# Patient Record
Sex: Male | Born: 1955 | Race: White | Hispanic: No | Marital: Married | Smoking: Current every day smoker
Health system: Southern US, Community
[De-identification: ages and names within clinical notes are randomized; demographics above are authoritative.]

## PROBLEM LIST (undated history)

## (undated) DIAGNOSIS — I10 Essential (primary) hypertension: Secondary | ICD-10-CM

## (undated) DIAGNOSIS — Z9889 Other specified postprocedural states: Secondary | ICD-10-CM

## (undated) DIAGNOSIS — K829 Disease of gallbladder, unspecified: Secondary | ICD-10-CM

## (undated) DIAGNOSIS — K219 Gastro-esophageal reflux disease without esophagitis: Secondary | ICD-10-CM

## (undated) DIAGNOSIS — Z8719 Personal history of other diseases of the digestive system: Secondary | ICD-10-CM

## (undated) DIAGNOSIS — K5792 Diverticulitis of intestine, part unspecified, without perforation or abscess without bleeding: Secondary | ICD-10-CM

## (undated) HISTORY — PX: HERNIA REPAIR: SHX51

---

## 2019-12-23 ENCOUNTER — Other Ambulatory Visit: Payer: Self-pay

## 2019-12-23 ENCOUNTER — Ambulatory Visit (INDEPENDENT_AMBULATORY_CARE_PROVIDER_SITE_OTHER): Payer: 59 | Admitting: Gastroenterology

## 2019-12-23 VITALS — BP 97/67 | HR 75 | Temp 97.8°F | Ht 70.0 in | Wt 176.6 lb

## 2019-12-23 DIAGNOSIS — K5900 Constipation, unspecified: Secondary | ICD-10-CM | POA: Diagnosis not present

## 2019-12-23 DIAGNOSIS — K8050 Calculus of bile duct without cholangitis or cholecystitis without obstruction: Secondary | ICD-10-CM

## 2019-12-23 DIAGNOSIS — K5732 Diverticulitis of large intestine without perforation or abscess without bleeding: Secondary | ICD-10-CM | POA: Diagnosis not present

## 2019-12-23 DIAGNOSIS — Z791 Long term (current) use of non-steroidal anti-inflammatories (NSAID): Secondary | ICD-10-CM | POA: Diagnosis not present

## 2019-12-23 MED ORDER — OMEPRAZOLE 40 MG PO CPDR: 40.0000 mg | DELAYED_RELEASE_CAPSULE | Freq: Every day | ORAL | 3 refills | Status: DC

## 2019-12-23 NOTE — Progress Notes (Signed)
Maurice Bellows MD, MRCP(U.K) [address]  [place]  [place], [place] [address]  Main: [phone]  Fax: [phone]   Gastroenterology Consultation  Referring Provider:     Roselee Culver, MD Primary Care Physician:  Suzan Garibaldi, FNP Primary Gastroenterologist:  Dr. Jonathon Miranda  Reason for Consultation:     Diverticulitis        HPI:   Maurice Miranda is a 64 y.o. y/o male referred forDiverticulitis.  The patient presented to the emergency room on 11/04/2019 at [facility] with left lower quadrant pain.  CT scan of the abdomen and pelvis showed diverticulosis and no clear evidence of diverticulitis.  The patient had leukocytosis and due to high clinical suspicion for diverticulitis despite negative imaging was given antibiotics.  Evaluated by general surgery at that point of time and agreed with the plan.  There was also concern for 1.4 cm stone in the neck of a mildly distended gallbladder.  Normal LFTs and lipase and no right upper quadrant pain.  He was discharged home to follow-up as an outpatient.  11/30/2019 seen by general surgery as an outpatient noted to have epigastric and right upper quadrant pain.  No prior colonoscopy.  As per the general surgery note he had a colonoscopy scheduled on 12/22/2019.  And note was also mentioned that there was no pressing urgent need for cholecystectomy at this point of time.  I reviewed the CT scan on 11/04/2019 which showed a dilation of the main pancreatic duct in the head of 4 mm.  Mild dilation of the CBD at 9 mm.LFTs were normal..   He states that he is here to see me because his colonoscopy that was scheduled by [facility] was out of network and was canceled at the last moment.  He states that he has had right upper quadrant pain on and off associated after eating greasy food for over a year.  In addition has had constipation since 1 year does not have a bowel movement every day very hard and causes abdominal discomfort.  Occasionally noticed blood on  the stool.  No family history of colon cancer or polyps.  No prior colonoscopy in the past.  He has been taking BC powders on a daily basis for many years.  He is a Pharmacist, community.  Denies any excess alcohol consumption.   Prior to Admission medications   Not on File    No family history on file.   Social History   Tobacco Use  . Smoking status: Not on file  Substance Use Topics  . Alcohol use: Not on file  . Drug use: Not on file    Allergies as of 12/23/2019  . (No Known Allergies)    Review of Systems:    All systems reviewed and negative except where noted in HPI.   Physical Exam:  BP 97/67   Pulse 75   Temp 97.8 F (36.6 C)   Ht 5\' 10"  (1.778 m)   Wt 176 lb 9.6 oz (80.1 kg)   BMI 25.34 kg/m  No LMP for male patient. Psych:  Alert and cooperative. Normal mood and affect. General:   Alert,  Well-developed, well-nourished, pleasant and cooperative in NAD Head:  Normocephalic and atraumatic. Eyes:  Sclera clear, no icterus.   Conjunctiva pink.     Lungs:  Respirations even and unlabored.  Clear throughout to auscultation.   No wheezes, crackles, or rhonchi. No acute distress. Heart:  Regular rate and rhythm; no murmurs, clicks, rubs, or gallops. Abdomen:  Normal bowel sounds.  No bruits.  Soft, non-tender and non-distended without masses, hepatosplenomegaly or hernias noted.  No guarding or rebound tenderness.    Neurologic:  Alert and oriented x3;  grossly normal neurologically. Psych:  Alert and cooperative. Normal mood and affect.  Imaging Studies: No results found.  Assessment and Plan:   Maurice Miranda is a 64 y.o. y/o male has been referred for diverticulitis.  Empirically treated for diverticulitis in January 2020 with no radiological evidence of the same.  He has no left lower quadrant pain at this point of time.  But he does have symptomatic biliary colic with stone seen in his gallbladder.  He also suffers from constipation and on and off rectal  bleeding.  Plan 1.  Incidentally noted dilated common bile duct on CT scan in January and dilated pancreatic duct in the head of the pancreas.  Will need further evaluation with MRCP.  LFTs have been normal. 2.  Colonoscopy to evaluate diverticulitis since he has never had one before. 3.  Stop all NSAID use. 4.  H. pylori breath test 5.  Commence on a PPI Prilosec 40 mg once a day 6.  Commence on MiraLAX 1 capful twice daily 7.  Refer to surgery for cholecystectomy due to symptomatic biliary colic Dr.Pabon  I have discussed alternative options, risks & benefits,  which include, but are not limited to, bleeding, infection, perforation,respiratory complication & drug reaction.  The patient agrees with this plan & written consent will be obtained.     Follow up in 6 weeks  Dr Maurice Bellows MD,MRCP(U.K)

## 2019-12-25 ENCOUNTER — Telehealth: Payer: Self-pay

## 2019-12-25 LAB — H. PYLORI BREATH TEST: H pylori Breath Test: NEGATIVE

## 2019-12-25 NOTE — Telephone Encounter (Signed)
Spoke with pt and informed him of MRI appointment information.

## 2019-12-28 ENCOUNTER — Encounter: Payer: Self-pay | Admitting: Gastroenterology

## 2019-12-29 ENCOUNTER — Other Ambulatory Visit: Payer: Self-pay

## 2019-12-29 ENCOUNTER — Ambulatory Visit
Admission: RE | Admit: 2019-12-29 | Discharge: 2019-12-29 | Disposition: A | Discharge status: Home / Self Care | Payer: 59 | Source: Ambulatory Visit | Attending: Gastroenterology | Admitting: Gastroenterology

## 2019-12-29 ENCOUNTER — Other Ambulatory Visit: Payer: Self-pay | Admitting: Gastroenterology

## 2019-12-29 DIAGNOSIS — K8050 Calculus of bile duct without cholangitis or cholecystitis without obstruction: Secondary | ICD-10-CM | POA: Diagnosis not present

## 2019-12-29 MED ORDER — GADOBUTROL 1 MMOL/ML IV SOLN
8.0000 mL | Freq: Once | INTRAVENOUS | Status: AC | PRN
  Administered 2019-12-29: 8 mL via INTRAVENOUS

## 2020-01-03 ENCOUNTER — Encounter: Payer: Self-pay | Admitting: Gastroenterology

## 2020-01-04 ENCOUNTER — Telehealth: Payer: Self-pay

## 2020-01-04 NOTE — Telephone Encounter (Signed)
-----   Message from Jonathon Bellows, MD sent at 01/03/2020  7:45 PM EDT ----- Normal except for gall stones .

## 2020-01-04 NOTE — Telephone Encounter (Signed)
Spoke with pt and informed him of MRI result.

## 2020-01-04 NOTE — Telephone Encounter (Signed)
Called pt to inform him of MRI result.  Unable to contact, LVM to return call

## 2020-01-06 ENCOUNTER — Other Ambulatory Visit: Payer: Self-pay

## 2020-01-06 ENCOUNTER — Ambulatory Visit (INDEPENDENT_AMBULATORY_CARE_PROVIDER_SITE_OTHER): Payer: 59 | Admitting: Surgery

## 2020-01-06 ENCOUNTER — Encounter: Payer: Self-pay | Admitting: Surgery

## 2020-01-06 VITALS — BP 107/68 | HR 78 | Temp 97.7°F | Resp 12 | Ht 70.0 in | Wt 176.6 lb

## 2020-01-06 DIAGNOSIS — K8020 Calculus of gallbladder without cholecystitis without obstruction: Secondary | ICD-10-CM | POA: Diagnosis not present

## 2020-01-06 NOTE — Patient Instructions (Addendum)
Our surgery will contact you to schedule your surgery. Please have the BLUE SHEET available when she calls you.   Cholelithiasis  Cholelithiasis is also called "gallstones." It is a kind of gallbladder disease. The gallbladder is an organ that stores a liquid (bile) that helps you digest fat. Gallstones may not cause symptoms (may be silent gallstones) until they cause a blockage, and then they can cause pain (gallbladder attack). Follow these instructions at home:  Take over-the-counter and prescription medicines only as told by your doctor.  Stay at a healthy weight.  Eat healthy foods. This includes: ? Eating fewer fatty foods, like fried foods. ? Eating fewer refined carbs (refined carbohydrates). Refined carbs are breads and grains that are highly processed, like white bread and white rice. Instead, choose whole grains like whole-wheat bread and brown rice. ? Eating more fiber. Almonds, fresh fruit, and beans are healthy sources of fiber.  Keep all follow-up visits as told by your doctor. This is important. Contact a doctor if:  You have sudden pain in the upper right side of your belly (abdomen). Pain might spread to your right shoulder or your chest. This may be a sign of a gallbladder attack.  You feel sick to your stomach (are nauseous).  You throw up (vomit).  You have been diagnosed with gallstones that have no symptoms and you get: ? Belly pain. ? Discomfort, burning, or fullness in the upper part of your belly (indigestion). Get help right away if:  You have sudden pain in the upper right side of your belly, and it lasts for more than 2 hours.  You have belly pain that lasts for more than 5 hours.  You have a fever or chills.  You keep feeling sick to your stomach or you keep throwing up.  Your skin or the whites of your eyes turn yellow (jaundice).  You have dark-colored pee (urine).  You have light-colored poop (stool). Summary  Cholelithiasis is also  called "gallstones."  The gallbladder is an organ that stores a liquid (bile) that helps you digest fat.  Silent gallstones are gallstones that do not cause symptoms.  A gallbladder attack may cause sudden pain in the upper right side of your belly. Pain might spread to your right shoulder or your chest. If this happens, contact your doctor.  If you have sudden pain in the upper right side of your belly that lasts for more than 2 hours, get help right away. This information is not intended to replace advice given to you by your health care provider. Make sure you discuss any questions you have with your health care provider. Document Revised: 09/06/2017 Document Reviewed: 06/10/2016 Elsevier Patient Education  2020 [employer].

## 2020-01-07 ENCOUNTER — Other Ambulatory Visit
Admission: RE | Admit: 2020-01-07 | Discharge: 2020-01-07 | Disposition: A | Discharge status: Home / Self Care | Payer: 59 | Source: Ambulatory Visit | Attending: Gastroenterology | Admitting: Gastroenterology

## 2020-01-07 DIAGNOSIS — Z20822 Contact with and (suspected) exposure to covid-19: Secondary | ICD-10-CM | POA: Insufficient documentation

## 2020-01-07 DIAGNOSIS — Z01812 Encounter for preprocedural laboratory examination: Secondary | ICD-10-CM | POA: Diagnosis not present

## 2020-01-07 LAB — SARS CORONAVIRUS 2 (TAT 6-24 HRS): SARS Coronavirus 2: NEGATIVE

## 2020-01-07 NOTE — H&P (View-Only) (Signed)
Patient ID: Maurice Miranda, male   DOB: 1955/11/24, 64 y.o.   MRN: [id number]  HPI Maurice Miranda is a 64 y.o. male in consultation at the request of Dr. Vicente Males for right upper quadrant pain.  He reports he has this subacute pain for the last couple months.  He states that his pain is within the right upper quadrant usually following greasy foods.  It is sharp is moderate and intermittent.  He also reports that the pain radiates to the back and to his right shoulder.  He does have a history of diverticulosis that apparently clinically developed diverticulitis earlier in the year.  He underwent work-up including a CT scan and it at a outside facility that I have personally reviewed the report showing evidence of diverticulosis without diverticulitis.  He also had evidence of questionable biliary disease and Vicente Males ordered an MRCP that I have personally reviewed showing evidence of cholelithiasis.  Reviewing records from outside facility it seems that he did have cholelithiasis with actually increased in the common bile duct up to 9 mm. He does have an upcoming colonoscopy next week by Dr. Vicente Males.  CBC and CMP were completely normal.     HPI  History reviewed. No pertinent past medical history.  History reviewed. No pertinent surgical history.  Family History  Problem Relation Age of Onset  . Diverticulitis Mother     Social History Social History   Tobacco Use  . Smoking status: Current Every Day Smoker    Packs/day: 0.50    Years: 45.00    Pack years: 22.50    Types: Cigarettes  . Smokeless tobacco: Former Systems developer    Types: Chew  Substance Use Topics  . Alcohol use: Never  . Drug use: Never    No Known Allergies  Current Outpatient Medications  Medication Sig Dispense Refill  . acetaminophen (TYLENOL) 325 MG tablet Take by mouth.    Marland Kitchen albuterol (VENTOLIN HFA) 108 (90 Base) MCG/ACT inhaler Inhale into the lungs.    . busPIRone (BUSPAR) 7.5 MG tablet Take 7.5 mg by mouth 2 (two) times  daily.    . [employer] BISACODYL 5 MG EC tablet Take 5 mg by mouth once.    . hydrOXYzine (VISTARIL) 25 MG capsule Take 25 mg by mouth 2 (two) times daily.    Marland Kitchen lisinopril-hydrochlorothiazide (ZESTORETIC) 20-12.5 MG tablet Take by mouth.    Marland Kitchen omeprazole (PRILOSEC) 40 MG capsule Take 1 capsule (40 mg total) by mouth daily. 90 capsule 3  . pantoprazole (PROTONIX) 40 MG tablet Take 40 mg by mouth daily.    Marland Kitchen SPIRIVA RESPIMAT 2.5 MCG/ACT AERS SMARTSIG:2 Puff(s) By Mouth Every Morning     No current facility-administered medications for this visit.     Review of Systems Full ROS  was asked and was negative except for the information on the HPI  Physical Exam Blood pressure 107/68, pulse 78, temperature 97.7 F (36.5 C), resp. rate 12, height 5\' 10"  (1.778 m), weight 176 lb 9.6 oz (80.1 kg), SpO2 98 %. CONSTITUTIONAL: NAD EYES: Pupils are equal, round, and reactive to light, Sclera are non-icteric. EARS, NOSE, MOUTH AND THROAT: The oropharynx is clear. The oral mucosa is pink and moist. Hearing is intact to voice. LYMPH NODES:  Lymph nodes in the neck are normal. RESPIRATORY:  Lungs are clear. There is normal respiratory effort, with equal breath sounds bilaterally, and without pathologic use of accessory muscles. CARDIOVASCULAR: Heart is regular without murmurs, gallops, or rubs. GI: The abdomen is  soft,  mild tenderness to palpation in the right upper quadrant.  No peritonitis.  There are no palpable masses. There is no hepatosplenomegaly. There are normal bowel sounds in all quadrants. GU: Rectal deferred.   MUSCULOSKELETAL: Normal muscle strength and tone. No cyanosis or edema.   SKIN: Turgor is good and there are no pathologic skin lesions or ulcers. NEUROLOGIC: Motor and sensation is grossly normal. Cranial nerves are grossly intact. PSYCH:  Oriented to person, place and time. Affect is normal.  Data Reviewed  I have personally reviewed the patient's imaging, laboratory findings and medical  records.    Assessment/Plan 64 year old male with right upper quadrant pain consistent with biliary colic.  In addition to that he does have lower abdominal pain that is nonspecific and could certainly be explained by diverticulitis or irritable bowel syndrome.  Most pressing issue at this time is definitely his gallbladder.  I do believe that he is a good candidate for robotic cholecystectomy. I discussed the procedure in detail.  The patient was given Neurosurgeon.  We discussed the risks and benefits of a laparoscopic cholecystectomy and possible cholangiogram including, but not limited to bleeding, infection, injury to surrounding structures such as the intestine or liver, bile leak, retained gallstones, need to convert to an open procedure, prolonged diarrhea, blood clots such as  DVT, common bile duct injury, anesthesia risks, and possible need for additional procedures.  The likelihood of improvement in symptoms and return to the patient's normal status is good. We discussed the typical post-operative recovery course. Time spent with the patient was 60 minutes, with more than 50% of the time spent in face-to-face education, counseling and care coordination.   Copy of this report was sent to the referring provider   Caroleen Hamman, MD FACS General Surgeon 01/07/2020, 3:06 PM

## 2020-01-07 NOTE — Progress Notes (Signed)
Patient ID: Maurice Miranda, male   DOB: April 28, 1956, 64 y.o.   MRN: [id number]  HPI Maurice Miranda is a 64 y.o. male in consultation at the request of Dr. Vicente Males for right upper quadrant pain.  He reports he has this subacute pain for the last couple months.  He states that his pain is within the right upper quadrant usually following greasy foods.  It is sharp is moderate and intermittent.  He also reports that the pain radiates to the back and to his right shoulder.  He does have a history of diverticulosis that apparently clinically developed diverticulitis earlier in the year.  He underwent work-up including a CT scan and it at a outside facility that I have personally reviewed the report showing evidence of diverticulosis without diverticulitis.  He also had evidence of questionable biliary disease and Vicente Males ordered an MRCP that I have personally reviewed showing evidence of cholelithiasis.  Reviewing records from outside facility it seems that he did have cholelithiasis with actually increased in the common bile duct up to 9 mm. He does have an upcoming colonoscopy next week by Dr. Vicente Males.  CBC and CMP were completely normal.     HPI  History reviewed. No pertinent past medical history.  History reviewed. No pertinent surgical history.  Family History  Problem Relation Age of Onset  . Diverticulitis Mother     Social History Social History   Tobacco Use  . Smoking status: Current Every Day Smoker    Packs/day: 0.50    Years: 45.00    Pack years: 22.50    Types: Cigarettes  . Smokeless tobacco: Former Systems developer    Types: Chew  Substance Use Topics  . Alcohol use: Never  . Drug use: Never    No Known Allergies  Current Outpatient Medications  Medication Sig Dispense Refill  . acetaminophen (TYLENOL) 325 MG tablet Take by mouth.    Marland Kitchen albuterol (VENTOLIN HFA) 108 (90 Base) MCG/ACT inhaler Inhale into the lungs.    . busPIRone (BUSPAR) 7.5 MG tablet Take 7.5 mg by mouth 2 (two) times  daily.    . CVS BISACODYL 5 MG EC tablet Take 5 mg by mouth once.    . hydrOXYzine (VISTARIL) 25 MG capsule Take 25 mg by mouth 2 (two) times daily.    Marland Kitchen lisinopril-hydrochlorothiazide (ZESTORETIC) 20-12.5 MG tablet Take by mouth.    Marland Kitchen omeprazole (PRILOSEC) 40 MG capsule Take 1 capsule (40 mg total) by mouth daily. 90 capsule 3  . pantoprazole (PROTONIX) 40 MG tablet Take 40 mg by mouth daily.    Marland Kitchen SPIRIVA RESPIMAT 2.5 MCG/ACT AERS SMARTSIG:2 Puff(s) By Mouth Every Morning     No current facility-administered medications for this visit.     Review of Systems Full ROS  was asked and was negative except for the information on the HPI  Physical Exam Blood pressure 107/68, pulse 78, temperature 97.7 F (36.5 C), resp. rate 12, height 5\' 10"  (1.778 m), weight 176 lb 9.6 oz (80.1 kg), SpO2 98 %. CONSTITUTIONAL: NAD EYES: Pupils are equal, round, and reactive to light, Sclera are non-icteric. EARS, NOSE, MOUTH AND THROAT: The oropharynx is clear. The oral mucosa is pink and moist. Hearing is intact to voice. LYMPH NODES:  Lymph nodes in the neck are normal. RESPIRATORY:  Lungs are clear. There is normal respiratory effort, with equal breath sounds bilaterally, and without pathologic use of accessory muscles. CARDIOVASCULAR: Heart is regular without murmurs, gallops, or rubs. GI: The abdomen is  soft,  mild tenderness to palpation in the right upper quadrant.  No peritonitis.  There are no palpable masses. There is no hepatosplenomegaly. There are normal bowel sounds in all quadrants. GU: Rectal deferred.   MUSCULOSKELETAL: Normal muscle strength and tone. No cyanosis or edema.   SKIN: Turgor is good and there are no pathologic skin lesions or ulcers. NEUROLOGIC: Motor and sensation is grossly normal. Cranial nerves are grossly intact. PSYCH:  Oriented to person, place and time. Affect is normal.  Data Reviewed  I have personally reviewed the patient's imaging, laboratory findings and medical  records.    Assessment/Plan 64 year old male with right upper quadrant pain consistent with biliary colic.  In addition to that he does have lower abdominal pain that is nonspecific and could certainly be explained by diverticulitis or irritable bowel syndrome.  Most pressing issue at this time is definitely his gallbladder.  I do believe that he is a good candidate for robotic cholecystectomy. I discussed the procedure in detail.  The patient was given Neurosurgeon.  We discussed the risks and benefits of a laparoscopic cholecystectomy and possible cholangiogram including, but not limited to bleeding, infection, injury to surrounding structures such as the intestine or liver, bile leak, retained gallstones, need to convert to an open procedure, prolonged diarrhea, blood clots such as  DVT, common bile duct injury, anesthesia risks, and possible need for additional procedures.  The likelihood of improvement in symptoms and return to the patient's normal status is good. We discussed the typical post-operative recovery course. Time spent with the patient was 60 minutes, with more than 50% of the time spent in face-to-face education, counseling and care coordination.   Copy of this report was sent to the referring provider   Caroleen Hamman, MD FACS General Surgeon 01/07/2020, 3:06 PM

## 2020-01-10 MED ORDER — INDOCYANINE GREEN 25 MG IV SOLR
7.5000 mg | Freq: Once | INTRAVENOUS | Status: DC
  Filled 2020-01-10: qty 10

## 2020-01-11 ENCOUNTER — Ambulatory Visit: Payer: 59 | Admitting: Anesthesiology

## 2020-01-11 ENCOUNTER — Other Ambulatory Visit: Payer: Self-pay

## 2020-01-11 ENCOUNTER — Emergency Department: Payer: 59

## 2020-01-11 ENCOUNTER — Emergency Department
Admission: EM | Admit: 2020-01-11 | Discharge: 2020-01-11 | Disposition: A | Discharge status: Home / Self Care | Payer: 59 | Source: Home / Self Care | Attending: Emergency Medicine | Admitting: Emergency Medicine

## 2020-01-11 ENCOUNTER — Encounter
Admission: RE | Disposition: A | Discharge status: Home / Self Care | Payer: Self-pay | Source: Home / Self Care | Attending: Gastroenterology

## 2020-01-11 ENCOUNTER — Ambulatory Visit
Admission: RE | Admit: 2020-01-11 | Discharge: 2020-01-11 | Disposition: A | Discharge status: Home / Self Care | Payer: 59 | Attending: Gastroenterology | Admitting: Gastroenterology

## 2020-01-11 ENCOUNTER — Encounter: Payer: Self-pay | Admitting: Emergency Medicine

## 2020-01-11 DIAGNOSIS — Z9889 Other specified postprocedural states: Secondary | ICD-10-CM | POA: Insufficient documentation

## 2020-01-11 DIAGNOSIS — Z79899 Other long term (current) drug therapy: Secondary | ICD-10-CM | POA: Insufficient documentation

## 2020-01-11 DIAGNOSIS — R1011 Right upper quadrant pain: Secondary | ICD-10-CM | POA: Insufficient documentation

## 2020-01-11 DIAGNOSIS — Z09 Encounter for follow-up examination after completed treatment for conditions other than malignant neoplasm: Secondary | ICD-10-CM | POA: Diagnosis not present

## 2020-01-11 DIAGNOSIS — F17210 Nicotine dependence, cigarettes, uncomplicated: Secondary | ICD-10-CM | POA: Insufficient documentation

## 2020-01-11 DIAGNOSIS — K635 Polyp of colon: Secondary | ICD-10-CM

## 2020-01-11 DIAGNOSIS — K5732 Diverticulitis of large intestine without perforation or abscess without bleeding: Secondary | ICD-10-CM

## 2020-01-11 DIAGNOSIS — K219 Gastro-esophageal reflux disease without esophagitis: Secondary | ICD-10-CM | POA: Insufficient documentation

## 2020-01-11 DIAGNOSIS — K8050 Calculus of bile duct without cholangitis or cholecystitis without obstruction: Secondary | ICD-10-CM

## 2020-01-11 DIAGNOSIS — D122 Benign neoplasm of ascending colon: Secondary | ICD-10-CM | POA: Insufficient documentation

## 2020-01-11 DIAGNOSIS — K8020 Calculus of gallbladder without cholecystitis without obstruction: Secondary | ICD-10-CM | POA: Insufficient documentation

## 2020-01-11 HISTORY — PX: COLONOSCOPY WITH PROPOFOL: SHX5780

## 2020-01-11 HISTORY — DX: Disease of gallbladder, unspecified: K82.9

## 2020-01-11 HISTORY — DX: Gastro-esophageal reflux disease without esophagitis: K21.9

## 2020-01-11 HISTORY — DX: Diverticulitis of intestine, part unspecified, without perforation or abscess without bleeding: K57.92

## 2020-01-11 HISTORY — DX: Other specified postprocedural states: Z98.890

## 2020-01-11 LAB — CBC WITH DIFFERENTIAL/PLATELET
Abs Immature Granulocytes: 0.06 10*3/uL (ref 0.00–0.07)
Basophils Absolute: 0.1 10*3/uL (ref 0.0–0.1)
Basophils Relative: 1 %
Eosinophils Absolute: 0.1 10*3/uL (ref 0.0–0.5)
Eosinophils Relative: 1 %
HCT: 44.6 % (ref 39.0–52.0)
Hemoglobin: 15.7 g/dL (ref 13.0–17.0)
Immature Granulocytes: 1 %
Lymphocytes Relative: 16 %
Lymphs Abs: 1.7 10*3/uL (ref 0.7–4.0)
MCH: 30.8 pg (ref 26.0–34.0)
MCHC: 35.2 g/dL (ref 30.0–36.0)
MCV: 87.5 fL (ref 80.0–100.0)
Monocytes Absolute: 0.5 10*3/uL (ref 0.1–1.0)
Monocytes Relative: 5 %
Neutro Abs: 8.3 10*3/uL — ABNORMAL HIGH (ref 1.7–7.7)
Neutrophils Relative %: 76 %
Platelets: 204 10*3/uL (ref 150–400)
RBC: 5.1 MIL/uL (ref 4.22–5.81)
RDW: 12.1 % (ref 11.5–15.5)
WBC: 10.7 10*3/uL — ABNORMAL HIGH (ref 4.0–10.5)
nRBC: 0 % (ref 0.0–0.2)

## 2020-01-11 LAB — COMPREHENSIVE METABOLIC PANEL
ALT: 25 U/L (ref 0–44)
AST: 22 U/L (ref 15–41)
Albumin: 4 g/dL (ref 3.5–5.0)
Alkaline Phosphatase: 72 U/L (ref 38–126)
Anion gap: 6 (ref 5–15)
BUN: 17 mg/dL (ref 8–23)
CO2: 25 mmol/L (ref 22–32)
Calcium: 8.8 mg/dL — ABNORMAL LOW (ref 8.9–10.3)
Chloride: 108 mmol/L (ref 98–111)
Creatinine, Ser: 0.84 mg/dL (ref 0.61–1.24)
GFR calc Af Amer: >60 mL/min (ref >60)
GFR calc non Af Amer: >60 mL/min (ref >60)
Glucose, Bld: 166 mg/dL — ABNORMAL HIGH (ref 70–99)
Potassium: 3.2 mmol/L — ABNORMAL LOW (ref 3.5–5.1)
Sodium: 139 mmol/L (ref 135–145)
Total Bilirubin: 0.8 mg/dL (ref 0.3–1.2)
Total Protein: 6.3 g/dL — ABNORMAL LOW (ref 6.5–8.1)

## 2020-01-11 LAB — LIPASE, BLOOD: Lipase: 39 U/L (ref 11–51)

## 2020-01-11 SURGERY — COLONOSCOPY WITH PROPOFOL
Anesthesia: General

## 2020-01-11 MED ORDER — ACETAMINOPHEN 500 MG PO TABS: 1000.0000 mg | ORAL_TABLET | ORAL | Status: DC

## 2020-01-11 MED ORDER — MORPHINE SULFATE (PF) 4 MG/ML IV SOLN
4.0000 mg | Freq: Once | INTRAVENOUS | Status: AC
  Administered 2020-01-11: 4 mg via INTRAVENOUS
  Filled 2020-01-11: qty 1

## 2020-01-11 MED ORDER — CEFAZOLIN SODIUM-DEXTROSE 2-4 GM/100ML-% IV SOLN: 2.0000 g | INTRAVENOUS | Status: DC

## 2020-01-11 MED ORDER — LACTATED RINGERS IV BOLUS
1000.0000 mL | Freq: Once | INTRAVENOUS | Status: AC
  Administered 2020-01-11: 1000 mL via INTRAVENOUS

## 2020-01-11 MED ORDER — PROPOFOL 500 MG/50ML IV EMUL
INTRAVENOUS | Status: DC | PRN
  Administered 2020-01-11: 50 ug/kg/min via INTRAVENOUS

## 2020-01-11 MED ORDER — ONDANSETRON 4 MG PO TBDP: 4.0000 mg | ORAL_TABLET | Freq: Three times a day (TID) | ORAL | 0 refills | Status: AC | PRN

## 2020-01-11 MED ORDER — GABAPENTIN 300 MG PO CAPS: 300.0000 mg | ORAL_CAPSULE | ORAL | Status: DC

## 2020-01-11 MED ORDER — MIDAZOLAM HCL 5 MG/5ML IJ SOLN
INTRAMUSCULAR | Status: DC | PRN
  Administered 2020-01-11: 2 mg via INTRAVENOUS

## 2020-01-11 MED ORDER — SODIUM CHLORIDE 0.9 % IV SOLN
INTRAVENOUS | Status: DC
  Administered 2020-01-11: 09:00:00 1000 mL via INTRAVENOUS

## 2020-01-11 MED ORDER — LIDOCAINE HCL (PF) 2 % IJ SOLN
INTRAMUSCULAR | Status: DC | PRN
  Administered 2020-01-11: 80 mg

## 2020-01-11 MED ORDER — ONDANSETRON HCL 4 MG/2ML IJ SOLN
4.0000 mg | Freq: Once | INTRAMUSCULAR | Status: AC
  Administered 2020-01-11: 4 mg via INTRAVENOUS
  Filled 2020-01-11: qty 2

## 2020-01-11 MED ORDER — LIDOCAINE HCL (PF) 2 % IJ SOLN
INTRAMUSCULAR | Status: AC
  Filled 2020-01-11: qty 10

## 2020-01-11 MED ORDER — MIDAZOLAM HCL 2 MG/2ML IJ SOLN
INTRAMUSCULAR | Status: AC
  Filled 2020-01-11: qty 2

## 2020-01-11 MED ORDER — FENTANYL CITRATE (PF) 100 MCG/2ML IJ SOLN
INTRAMUSCULAR | Status: AC
  Filled 2020-01-11: qty 2

## 2020-01-11 MED ORDER — FENTANYL CITRATE (PF) 100 MCG/2ML IJ SOLN
INTRAMUSCULAR | Status: DC | PRN
  Administered 2020-01-11: 50 ug via INTRAVENOUS
  Administered 2020-01-11 (×2): 25 ug via INTRAVENOUS

## 2020-01-11 MED ORDER — HYDROCODONE-ACETAMINOPHEN 5-325 MG PO TABS: 1.0000 | ORAL_TABLET | Freq: Four times a day (QID) | ORAL | 0 refills | Status: DC | PRN

## 2020-01-11 MED ORDER — CELECOXIB 200 MG PO CAPS: 200.0000 mg | ORAL_CAPSULE | ORAL | Status: DC

## 2020-01-11 MED ORDER — POTASSIUM CHLORIDE CRYS ER 20 MEQ PO TBCR
40.0000 meq | EXTENDED_RELEASE_TABLET | Freq: Once | ORAL | Status: AC
  Administered 2020-01-11: 40 meq via ORAL
  Filled 2020-01-11: qty 2

## 2020-01-11 MED ORDER — PROPOFOL 10 MG/ML IV BOLUS
INTRAVENOUS | Status: DC | PRN
  Administered 2020-01-11: 30 mg via INTRAVENOUS
  Administered 2020-01-11: 20 mg via INTRAVENOUS

## 2020-01-11 NOTE — ED Notes (Signed)
X-ray at bedside

## 2020-01-11 NOTE — Op Note (Signed)
[facility] Gastroenterology Patient Name: Maurice Miranda Procedure Date: 01/11/2020 9:25 AM MRN: [id number] Account #: [id number] Date of Birth: June 23, 1956 Admit Type: Ambulatory Age: 64 Room: [facility] ENDO ROOM 4 Gender: Male Note Status: Finalized Procedure:             Colonoscopy Indications:           Follow-up of diverticulitis Providers:             Jonathon Bellows MD, MD Medicines:             Monitored Anesthesia Care Complications:         No immediate complications. Procedure:             Pre-Anesthesia Assessment:                        - Prior to the procedure, a History and Physical was                         performed, and patient medications, allergies and                         sensitivities were reviewed. The patient's tolerance                         of previous anesthesia was reviewed.                        - The risks and benefits of the procedure and the                         sedation options and risks were discussed with the                         patient. All questions were answered and informed                         consent was obtained.                        - ASA Grade Assessment: II - A patient with mild                         systemic disease.                        After obtaining informed consent, the colonoscope was                         passed under direct vision. Throughout the procedure,                         the patient's blood pressure, pulse, and oxygen                         saturations were monitored continuously. The                         Colonoscope was introduced through the anus and  advanced to the the cecum, identified by the                         appendiceal orifice. The colonoscopy was performed                         with ease. The patient tolerated the procedure well.                         The quality of the bowel preparation was poor. Findings:      The perianal and digital  rectal examinations were normal.      A large amount of semi-liquid stool was found in the entire colon,       interfering with visualization.      Two sessile polyps were found in the ascending colon. The polyps were 6       to 10 mm in size. These polyps were removed with a cold snare. Resection       and retrieval were complete. To prevent bleeding after the polypectomy,       two hemostatic clips were successfully placed. There was no bleeding at       the end of the procedure. Impression:            - Preparation of the colon was poor.                        - Stool in the entire examined colon.                        - Two 6 to 10 mm polyps in the ascending colon,                         removed with a cold snare. Resected and retrieved.                         Clips were placed. Recommendation:        - Discharge patient to home (with escort).                        - Resume previous diet.                        - Continue present medications.                        - Await pathology results.                        - Repeat colonoscopy in 1 month because the bowel                         preparation was suboptimal. Procedure Code(s):     --- Professional ---                        [phone], Colonoscopy, flexible; with removal of                         tumor(s), polyp(s), or other lesion(s) by snare  technique Diagnosis Code(s):     --- Professional ---                        K63.5, Polyp of colon                        K57.32, Diverticulitis of large intestine without                         perforation or abscess without bleeding CPT copyright 2019 American Medical Association. All rights reserved. The codes documented in this report are preliminary and upon coder review may  be revised to meet current compliance requirements. Jonathon Bellows, MD Jonathon Bellows MD, MD 01/11/2020 9:53:26 AM This report has been signed electronically. Number of Addenda: 0 Note  Initiated On: 01/11/2020 9:25 AM Scope Withdrawal Time: 0 hours 16 minutes 50 seconds  Total Procedure Duration: 0 hours 21 minutes 7 seconds  Estimated Blood Loss:  Estimated blood loss: none.      [facility]

## 2020-01-11 NOTE — ED Triage Notes (Signed)
Pt here for severe RLQ pain. Unlabored.  Had colonoscopy this AM with polyp removal.  VSS at this time. No fever. Has had dry heaving.  Started shortly after colonoscopy today but has had intermittent similar pain over last 2 months. Mentioned known gallstone but this pain is RLQ and wraps around to right back.

## 2020-01-11 NOTE — Anesthesia Postprocedure Evaluation (Signed)
Anesthesia Post Note  Patient: Maurice Miranda  Procedure(s) Performed: COLONOSCOPY WITH PROPOFOL (N/A )  Patient location during evaluation: PACU Anesthesia Type: General Level of consciousness: awake and alert Pain management: pain level controlled Vital Signs Assessment: post-procedure vital signs reviewed and stable Respiratory status: spontaneous breathing, nonlabored ventilation and respiratory function stable Cardiovascular status: blood pressure returned to baseline and stable Postop Assessment: no apparent nausea or vomiting Anesthetic complications: no     Last Vitals:  Vitals:   01/11/20 1032 01/11/20 1033  BP:  135/86  Pulse: 66 66  Resp: (!) 28 (!) 25  Temp:    SpO2: 100% 99%    Last Pain:  Vitals:   01/11/20 1033  TempSrc:   PainSc: 0-No pain                 Tera Mater

## 2020-01-11 NOTE — Transfer of Care (Signed)
Immediate Anesthesia Transfer of Care Note  Patient: Maurice Miranda  Procedure(s) Performed: COLONOSCOPY WITH PROPOFOL (N/A )  Patient Location: PACU  Anesthesia Type:General  Level of Consciousness: sedated  Airway & Oxygen Therapy: Patient Spontanous Breathing and Patient connected to nasal cannula oxygen  Post-op Assessment: Report given to RN and Post -op Vital signs reviewed and stable  Post vital signs: Reviewed and stable  Last Vitals:  Vitals Value Taken Time  BP 87/54 01/11/20 0953  Temp 35.8 C 01/11/20 0952  Pulse 65 01/11/20 0954  Resp 25 01/11/20 0954  SpO2 99 % 01/11/20 0954  Vitals shown include unvalidated device data.  Last Pain:  Vitals:   01/11/20 0952  TempSrc: Temporal  PainSc: Asleep         Complications: No apparent anesthesia complications

## 2020-01-11 NOTE — ED Provider Notes (Signed)
[facility] Emergency Department Provider Note   ____________________________________________   First MD Initiated Contact with Patient 01/11/20 1306     (approximate)  I have reviewed the triage vital signs and the nursing notes.   HISTORY  Chief Complaint Abdominal Pain    HPI Maurice Miranda is a 64 y.o. male with possible history of gallstones and GERD who presents to the ED complaining of abdominal pain.  Patient reports that he has been having intermittent episodes of right upper quadrant abdominal pain for least the past couple of months.  He has been seen by general surgery for this after being diagnosed with gallstones, subsequently scheduled for cholecystectomy in 10 days.  He has also been following with GI and had a colonoscopy performed earlier today.  Colonoscopy showed poor prep as well as 2 small polyps that were removed.  Patient states he had been doing well earlier in the day prior to his colonoscopy, but shortly after the procedure he developed severe right upper quadrant pain similar to prior episodes.  It was initially relatively mild, but became more severe after he tried to eat lunch.  He has not had any nausea or vomiting he denies any fevers recently.  He describes the pain as stabbing and radiating around to his right flank.        Past Medical History:  Diagnosis Date  . Diverticulitis   . Gallbladder disease   . GERD (gastroesophageal reflux disease)   . History of colonoscopy     There are no problems to display for this patient.   History reviewed. No pertinent surgical history.  Prior to Admission medications   Medication Sig Start Date End Date Taking? Authorizing Provider  acetaminophen (TYLENOL) 325 MG tablet Take by mouth. 11/05/19   [provider]  albuterol (VENTOLIN HFA) 108 (90 Base) MCG/ACT inhaler Inhale into the lungs. 09/05/19   [provider]  busPIRone (BUSPAR) 7.5 MG tablet Take 7.5 mg  by mouth 2 (two) times daily. 12/08/19   [provider]  [facility] BISACODYL 5 MG EC tablet Take 5 mg by mouth once. 12/09/19   [provider]  HYDROcodone-acetaminophen (NORCO/VICODIN) 5-325 MG tablet Take 1 tablet by mouth every 6 (six) hours as needed for moderate pain. 01/11/20   Blake Divine, MD  hydrOXYzine (VISTARIL) 25 MG capsule Take 25 mg by mouth 2 (two) times daily. 09/30/19   [provider]  lisinopril-hydrochlorothiazide (ZESTORETIC) 20-12.5 MG tablet Take by mouth. 09/24/19   [provider]  omeprazole (PRILOSEC) 40 MG capsule Take 1 capsule (40 mg total) by mouth daily. 12/23/19   Jonathon Bellows, MD  ondansetron (ZOFRAN ODT) 4 MG disintegrating tablet Take 1 tablet (4 mg total) by mouth every 8 (eight) hours as needed for nausea or vomiting. 01/11/20   Blake Divine, MD  pantoprazole (PROTONIX) 40 MG tablet Take 40 mg by mouth daily. 08/03/19   [provider]  SPIRIVA RESPIMAT 2.5 MCG/ACT AERS SMARTSIG:2 Puff(s) By Mouth Every Morning 09/05/19   [provider]    Allergies Patient has no known allergies.  Family History  Problem Relation Age of Onset  . Diverticulitis Mother     Social History Social History   Tobacco Use  . Smoking status: Current Every Day Smoker    Packs/day: 0.50    Years: 45.00    Pack years: 22.50    Types: Cigarettes  . Smokeless tobacco: Former Systems developer    Types: Chew  Substance Use Topics  .  Alcohol use: Never  . Drug use: Never    Review of Systems  Constitutional: No fever/chills Eyes: No visual changes. ENT: No sore throat. Cardiovascular: Denies chest pain. Respiratory: Denies shortness of breath. Gastrointestinal: Positive for abdominal pain.  No nausea, no vomiting.  No diarrhea.  No constipation. Genitourinary: Negative for dysuria. Musculoskeletal: Negative for back pain. Skin: Negative for rash. Neurological: Negative for headaches, focal weakness or  numbness.  ____________________________________________   PHYSICAL EXAM:  VITAL SIGNS: ED Triage Vitals  Enc Vitals Group     BP 01/11/20 1303 (!) 145/106     Pulse Rate 01/11/20 1303 85     Resp 01/11/20 1303 (!) 24     Temp 01/11/20 1306 97.9 F (36.6 C)     Temp Source 01/11/20 1306 Oral     SpO2 01/11/20 1303 100 %     Weight 01/11/20 1301 175 lb (79.4 kg)     Height 01/11/20 1301 5\' 10"  (1.778 m)     Head Circumference --      Peak Flow --      Pain Score 01/11/20 1301 10     Pain Loc --      Pain Edu? --      Excl. in [place]? --     Constitutional: Alert and oriented. Eyes: Conjunctivae are normal. Head: Atraumatic. Nose: No congestion/rhinnorhea. Mouth/Throat: Mucous membranes are moist. Neck: Normal ROM Cardiovascular: Normal rate, regular rhythm. Grossly normal heart sounds. Respiratory: Normal respiratory effort.  No retractions. Lungs CTAB. Gastrointestinal: Soft and tender to palpation in the right upper quadrant with voluntary guarding. No distention. Genitourinary: deferred Musculoskeletal: No lower extremity tenderness nor edema. Neurologic:  Normal speech and language. No gross focal neurologic deficits are appreciated. Skin:  Skin is warm, dry and intact. No rash noted. Psychiatric: Mood and affect are normal. Speech and behavior are normal.  ____________________________________________   LABS (all labs ordered are listed, but only abnormal results are displayed)  Labs Reviewed  CBC WITH DIFFERENTIAL/PLATELET - Abnormal; Notable for the following components:      Result Value   WBC 10.7 (*)    Neutro Abs 8.3 (*)    All other components within normal limits  COMPREHENSIVE METABOLIC PANEL - Abnormal; Notable for the following components:   Potassium 3.2 (*)    Glucose, Bld 166 (*)    Calcium 8.8 (*)    Total Protein 6.3 (*)    All other components within normal limits  LIPASE, BLOOD   ____________________________________________  EKG  ED ECG  REPORT I, Blake Divine, the attending physician, personally viewed and interpreted this ECG.   Date: 01/11/2020  EKG Time: 13:30  Rate: 83  Rhythm: normal sinus rhythm  Axis: Normal  Intervals:none  ST&T Change: None   PROCEDURES  Procedure(s) performed (including Critical Care):  Procedures   ____________________________________________   INITIAL IMPRESSION / ASSESSMENT AND PLAN / ED COURSE       64 year old male presents to the ED complaining of recurrent severe right upper quadrant abdominal pain after having a colonoscopy earlier today.  He appears focally tender in his right upper quadrant with voluntary guarding, will reassess for cholecystitis with right upper quadrant ultrasound.  Exam does not appear peritoneal and I have low suspicion for bowel perforation following his colonoscopy, will screen acute abdominal series.  Also plan to check labs including LFTs and lipase, treat with IV morphine and Zofran.  EKG without evidence of arrhythmia or ischemia, doubt cardiac etiology for his symptoms.  Lab  work is unremarkable, right upper quadrant ultrasound shows cholelithiasis without evidence of cholecystitis.  Acute abdominal x-ray is negative for free air or other acute process.  Patient reports feeling better at this time and is appropriate for discharge home given his symptoms are likely related to biliary colic.  He has cholecystectomy scheduled for 10 days from now and we will prescribe short course of pain medication as well as nausea Medication.  He was counseled to return to the ED for new or worsening symptoms, patient agrees with plan.      ____________________________________________   FINAL CLINICAL IMPRESSION(S) / ED DIAGNOSES  Final diagnoses:  RUQ pain  Biliary colic     ED Discharge Orders         Ordered    HYDROcodone-acetaminophen (NORCO/VICODIN) 5-325 MG tablet  Every 6 hours PRN     01/11/20 1511    ondansetron (ZOFRAN ODT) 4 MG  disintegrating tablet  Every 8 hours PRN     01/11/20 1511           Note:  This document was prepared using Dragon voice recognition software and may include unintentional dictation errors.   Blake Divine, MD 01/11/20 [phone]

## 2020-01-11 NOTE — Anesthesia Preprocedure Evaluation (Signed)
Anesthesia Evaluation  Patient identified by MRN, date of birth, ID band Patient awake    Reviewed: Allergy & Precautions, H&P , NPO status , Patient's Chart, lab work & pertinent test results, reviewed documented beta blocker date and time   Airway Mallampati: II   Neck ROM: full    Dental  (+) Poor Dentition   Pulmonary shortness of breath and with exertion, Current Smoker and Patient abstained from smoking.,    Pulmonary exam normal        Cardiovascular Exercise Tolerance: Good negative cardio ROS Normal cardiovascular exam Rhythm:regular Rate:Normal     Neuro/Psych negative neurological ROS  negative psych ROS   GI/Hepatic negative GI ROS, Neg liver ROS,   Endo/Other  negative endocrine ROS  Renal/GU negative Renal ROS  negative genitourinary   Musculoskeletal   Abdominal   Peds  Hematology negative hematology ROS (+)   Anesthesia Other Findings No past medical history on file. No past surgical history on file. BMI    Body Mass Index: 25.11 kg/m     Reproductive/Obstetrics negative OB ROS                             Anesthesia Physical Anesthesia Plan  ASA: III  Anesthesia Plan: General   Post-op Pain Management:    Induction:   PONV Risk Score and Plan:   Airway Management Planned:   Additional Equipment:   Intra-op Plan:   Post-operative Plan:   Informed Consent: I have reviewed the patients History and Physical, chart, labs and discussed the procedure including the risks, benefits and alternatives for the proposed anesthesia with the patient or authorized representative who has indicated his/her understanding and acceptance.     Dental Advisory Given  Plan Discussed with: CRNA  Anesthesia Plan Comments:         Anesthesia Quick Evaluation

## 2020-01-11 NOTE — ED Notes (Signed)
Pt transported otf for imaging  

## 2020-01-11 NOTE — H&P (Signed)
Jonathon Bellows, MD [address], [place], [place], [place], [address] [address], [place], [place], [place], [address] Phone: [phone]  Fax: [phone]  Primary Care Physician:  Suzan Garibaldi, FNP   Pre-Procedure History & Physical: HPI:  Maurice Miranda is a 64 y.o. male is here for an colonoscopy.   No past medical history on file.  No past surgical history on file.  Prior to Admission medications   Medication Sig Start Date End Date Taking? Authorizing Provider  acetaminophen (TYLENOL) 325 MG tablet Take by mouth. 11/05/19  Yes [provider]  albuterol (VENTOLIN HFA) 108 (90 Base) MCG/ACT inhaler Inhale into the lungs. 09/05/19  Yes [provider]  busPIRone (BUSPAR) 7.5 MG tablet Take 7.5 mg by mouth 2 (two) times daily. 12/08/19  Yes [provider]  [facility] BISACODYL 5 MG EC tablet Take 5 mg by mouth once. 12/09/19  Yes [provider]  hydrOXYzine (VISTARIL) 25 MG capsule Take 25 mg by mouth 2 (two) times daily. 09/30/19  Yes [provider]  lisinopril-hydrochlorothiazide (ZESTORETIC) 20-12.5 MG tablet Take by mouth. 09/24/19  Yes [provider]  omeprazole (PRILOSEC) 40 MG capsule Take 1 capsule (40 mg total) by mouth daily. 12/23/19  Yes Jonathon Bellows, MD  pantoprazole (PROTONIX) 40 MG tablet Take 40 mg by mouth daily. 08/03/19  Yes [provider]  SPIRIVA RESPIMAT 2.5 MCG/ACT AERS SMARTSIG:2 Puff(s) By Mouth Every Morning 09/05/19  Yes [provider]    Allergies as of 12/23/2019  . (No Known Allergies)    Family History  Problem Relation Age of Onset  . Diverticulitis Mother     Social History   Socioeconomic History  . Marital status: Married    Spouse name: Not on file  . Number of children: Not on file  . Years of education: Not on file  . Highest education level: Not on file  Occupational History  . Not on file  Tobacco Use  . Smoking status: Current Every Day Smoker   Packs/day: 0.50    Years: 45.00    Pack years: 22.50    Types: Cigarettes  . Smokeless tobacco: Former Systems developer    Types: Chew  Substance and Sexual Activity  . Alcohol use: Never  . Drug use: Never  . Sexual activity: Not on file  Other Topics Concern  . Not on file  Social History Narrative  . Not on file   Social Determinants of Health   Financial Resource Strain:   . Difficulty of Paying Living Expenses:   Food Insecurity:   . Worried About Charity fundraiser in the Last Year:   . Arboriculturist in the Last Year:   Transportation Needs:   . Film/video editor (Medical):   Marland Kitchen Lack of Transportation (Non-Medical):   Physical Activity:   . Days of Exercise per Week:   . Minutes of Exercise per Session:   Stress:   . Feeling of Stress :   Social Connections:   . Frequency of Communication with Friends and Family:   . Frequency of Social Gatherings with Friends and Family:   . Attends Religious Services:   . Active Member of Clubs or Organizations:   . Attends Archivist Meetings:   Marland Kitchen Marital Status:   Intimate Partner Violence:   . Fear of Current or Ex-Partner:   . Emotionally Abused:   Marland Kitchen Physically Abused:   . Sexually Abused:     Review of  Systems: See HPI, otherwise negative ROS  Physical Exam: BP (!) 134/93   Pulse 74   Temp (!) 96.7 F (35.9 C) (Temporal)   Resp 20   Ht 5\' 10"  (1.778 m)   Wt 79.4 kg   SpO2 100%   BMI 25.11 kg/m  General:   Alert,  pleasant and cooperative in NAD Head:  Normocephalic and atraumatic. Neck:  Supple; no masses or thyromegaly. Lungs:  Clear throughout to auscultation, normal respiratory effort.    Heart:  +S1, +S2, Regular rate and rhythm, No edema. Abdomen:  Soft, nontender and nondistended. Normal bowel sounds, without guarding, and without rebound.   Neurologic:  Alert and  oriented x4;  grossly normal neurologically.  Impression/Plan: Maurice Miranda is here for an colonoscopy to be performed for  recent diverticulitis.Risks, benefits, limitations, and alternatives regarding  colonoscopy have been reviewed with the patient.  Questions have been answered.  All parties agreeable.   Jonathon Bellows, MD  01/11/2020, 9:13 AM \

## 2020-01-11 NOTE — ED Notes (Signed)
Pt found standing at room door with IV pole in hand stating that he needed water to drink. Assisted pt back to bed and pt states "this place aint hitting on much". PT reassured that this RN was doing the best I could to meet his needs

## 2020-01-12 ENCOUNTER — Telehealth: Payer: Self-pay | Admitting: Surgery

## 2020-01-12 ENCOUNTER — Encounter: Payer: Self-pay | Admitting: *Deleted

## 2020-01-12 LAB — SURGICAL PATHOLOGY

## 2020-01-12 NOTE — Telephone Encounter (Signed)
Outgoing call made, left message for patient to call.  Please advise of the following dates regarding surgery   Surgery Date: 01/21/20 Preadmission Testing Date: 01/15/20 (phone 8a-1p) Covid Testing Date: 01/19/20 - patient advised to go to the [facility] ([place]) between 8a-1p   Also patient is to call [phone], between 1-3:00pm the day before surgery, to find out what time to arrive for surgery.

## 2020-01-15 ENCOUNTER — Other Ambulatory Visit: Payer: Self-pay

## 2020-01-15 ENCOUNTER — Telehealth: Payer: Self-pay | Admitting: *Deleted

## 2020-01-15 ENCOUNTER — Encounter
Admission: RE | Admit: 2020-01-15 | Discharge: 2020-01-15 | Disposition: A | Discharge status: Home / Self Care | Payer: 59 | Source: Ambulatory Visit | Attending: Surgery | Admitting: Surgery

## 2020-01-15 ENCOUNTER — Telehealth: Payer: Self-pay

## 2020-01-15 DIAGNOSIS — Z01818 Encounter for other preprocedural examination: Secondary | ICD-10-CM | POA: Diagnosis present

## 2020-01-15 HISTORY — DX: Personal history of other diseases of the digestive system: Z87.19

## 2020-01-15 HISTORY — DX: Essential (primary) hypertension: I10

## 2020-01-15 NOTE — Pre-Procedure Instructions (Signed)
EKG  ED ECG REPORT I, Blake Divine, the attending physician, personally viewed and interpreted this ECG.   Date: 01/11/2020  EKG Time: 13:30  Rate: 83  Rhythm: normal sinus rhythm  Axis: Normal  Intervals:none  ST&T Change: None   PROCEDURES  Procedure(s) performed (including Critical Care):  Procedures   ____________________________________________   INITIAL IMPRESSION / ASSESSMENT AND PLAN / ED COURSE      64 year old male presents to the ED complaining of recurrent severe right upper quadrant abdominal pain after having a colonoscopy earlier today.  He appears focally tender in his right upper quadrant with voluntary guarding, will reassess for cholecystitis with right upper quadrant ultrasound.  Exam does not appear peritoneal and I have low suspicion for bowel perforation following his colonoscopy, will screen acute abdominal series.  Also plan to check labs including LFTs and lipase, treat with IV morphine and Zofran.  EKG without evidence of arrhythmia or ischemia, doubt cardiac etiology for his symptoms.  Lab work is unremarkable, right upper quadrant ultrasound shows cholelithiasis without evidence of cholecystitis.  Acute abdominal x-ray is negative for free air or other acute process.  Patient reports feeling better at this time and is appropriate for discharge home given his symptoms are likely related to biliary colic.  He has cholecystectomy scheduled for 10 days from now and we will prescribe short course of pain medication as well as nausea Medication.  He was counseled to return to the ED for new or worsening symptoms, patient agrees with plan.    ____________________________________________   FINAL CLINICAL IMPRESSION(S) / ED DIAGNOSES  Final diagnoses:  RUQ pain  Biliary colic        ED Discharge Orders               Ordered     HYDROcodone-acetaminophen (NORCO/VICODIN) 5-325 MG tablet  Every 6 hours PRN     01/11/20  1511     ondansetron (ZOFRAN ODT) 4 MG disintegrating tablet  Every 8 hours PRN     01/11/20 1511            Note:  This document was prepared using Dragon voice recognition software and may include unintentional dictation errors.   Blake Divine, MD 01/11/20 1609         Electronically signed by Blake Divine, MD at 01/11/2020 4:09 PM   ED on 01/11/2020     Detailed Report    Note shared with patient

## 2020-01-15 NOTE — Telephone Encounter (Signed)
Patient returned call to office stating that he was in so much pain and was not happy with the recommendations Dr.Pabon's recommend of patient trying Tylenol or Ibuprofen along with the Hydrocodone he was prescribed at the ER on 01/11/20. Patient states he is not happy and nobody understands how much pain he is in. I apologized to patient and let him know that Dr.Pabon was not comfortable prescribing him any more pain medication. Patient hung the phone during conversation.

## 2020-01-15 NOTE — Telephone Encounter (Signed)
Left detailed message informing him of Dr.Pabon's recommendations. Per Dr.Pabon advised patient that he may take Ibuprofen or Tylenol to help with the pain or discomfort. Due to patient was recently in the ER for pain and was prescribed Hydrocodone during his visit. Instructed patient to give our office a call if he continues to have any problems or concerns.

## 2020-01-15 NOTE — Telephone Encounter (Signed)
Patient called and stated that he is in a lot of pain that he is doubled over, he is eating soft foods. He is scheduled for surgery on 01/21/20 Dr Dahlia Byes gallbladder. He would like to see if he can get some pain medication to help with the pain. [facility] [place]. Please call and advise

## 2020-01-15 NOTE — Addendum Note (Signed)
Addended by: Caroleen Hamman F on: 01/15/2020 11:29 AM   Modules accepted: Orders, SmartSet

## 2020-01-15 NOTE — Patient Instructions (Addendum)
Your procedure is scheduled on: 01-21-20 THURSDAY Report to Same Day Surgery 2nd floor medical mall [facility] Entrance-take elevator on left to 2nd floor.  Check in with surgery information desk.) To find out your arrival time please call [phone] between 1PM - 3PM on 01-20-20 [facility]   Remember: Instructions that are not followed completely may result in serious medical risk, up to and including death, or upon the discretion of your surgeon and anesthesiologist your surgery may need to be rescheduled.    _x___ 1. Do not eat food after midnight the night before your procedure. NO GUM OR CANDY AFTER MIDNIGHT. You may drink clear liquids up to 2 hours before you are scheduled to arrive at the hospital for your procedure.  Do not drink clear liquids within 2 hours of your scheduled arrival to the hospital.  Clear liquids include  --Water or Apple juice without pulp  --Gatorade  --Black Coffee or Clear Tea (No milk, no creamers, do not add anything to the coffee or Tea   ____Ensure clear carbohydrate drink on the way to the hospital for bariatric patients  ____Ensure clear carbohydrate drink 3 hours before surgery.    __x__ 2. No Alcohol for 24 hours before or after surgery.   __x__3. No Smoking or e-cigarettes for 24 prior to surgery.  Do not use any chewable tobacco products for at least 6 hour prior to surgery   ____  4. Bring all medications with you on the day of surgery if instructed.    __x__ 5. Notify your doctor if there is any change in your medical condition     (cold, fever, infections).    x___6. On the morning of surgery brush your teeth with toothpaste and water.  You may rinse your mouth with mouth wash if you wish.  Do not swallow any toothpaste or mouthwash.   Do not wear jewelry, make-up, hairpins, clips or nail polish.  Do not wear lotions, powders, or perfumes.  Do not shave 48 hours prior to surgery. Men may shave face and neck.  Do not bring valuables to  the hospital.    [facility] is not responsible for any belongings or valuables.               Contacts, dentures or bridgework may not be worn into surgery.  Leave your suitcase in the car. After surgery it may be brought to your room.  For patients admitted to the hospital, discharge time is determined by your treatment team.  _  Patients discharged the day of surgery will not be allowed to drive home.  You will need someone to drive you home and stay with you the night of your procedure.    Please read over the following fact sheets that you were given:   [facility] Preparing for Surgery  _x___ TAKE THE FOLLOWING MEDICATION THE MORNING OF SURGERY WITH A SMALL SIP OF WATER. These include:  1. BUSPAR (BUSPIRONE))  2. ZOFRAN (ONDANSETRON)  3. PROTONIX (PANTOPRAZOLE)  4. TAKE AN EXTRA PANTOPRAZOLE THE NIGHT BEFORE YOUR SURGERY  5.  6.  ____Fleets enema or Magnesium Citrate as directed.   _x___ Use CHG Soap or sage wipes as directed on instruction sheet   _X___ Use inhalers on the day of surgery and bring to hospital day of surgery-USE YOUR ALBUTEROL INHALER DAY OF SURGERY AND BRING ALBUTEROL [place]  ____ Stop Metformin and Janumet 2 days prior to surgery.    ____ Take 1/2 of  usual insulin dose the night before surgery and none on the morning  surgery.   ____ Follow recommendations from Cardiologist, Pulmonologist or PCP regarding stopping Aspirin, Coumadin, Plavix ,Eliquis, Effient, or Pradaxa, and Pletal.  X____Stop Anti-inflammatories such as Advil, Aleve, Ibuprofen, Motrin, Naproxen, Naprosyn, BC POWDERS,EXCEDRIN or aspirin products NOW-OK to take Tylenol   _x___ Stop supplements until after surgery-STOP SAW PALMETTO NOW-MAY RESUME AFTER SURGERY   ____ Bring C-Pap to the hospital.

## 2020-01-19 ENCOUNTER — Other Ambulatory Visit: Payer: Self-pay

## 2020-01-19 ENCOUNTER — Other Ambulatory Visit
Admission: RE | Admit: 2020-01-19 | Discharge: 2020-01-19 | Disposition: A | Discharge status: Home / Self Care | Payer: 59 | Source: Ambulatory Visit | Attending: Surgery | Admitting: Surgery

## 2020-01-19 DIAGNOSIS — Z01812 Encounter for preprocedural laboratory examination: Secondary | ICD-10-CM | POA: Insufficient documentation

## 2020-01-19 DIAGNOSIS — Z20822 Contact with and (suspected) exposure to covid-19: Secondary | ICD-10-CM | POA: Diagnosis not present

## 2020-01-19 LAB — POTASSIUM: Potassium: 3.7 mmol/L (ref 3.5–5.1)

## 2020-01-19 LAB — SARS CORONAVIRUS 2 (TAT 6-24 HRS): SARS Coronavirus 2: NEGATIVE

## 2020-01-20 MED ORDER — INDOCYANINE GREEN 25 MG IV SOLR
7.5000 mg | Freq: Once | INTRAVENOUS | Status: AC
  Administered 2020-01-21: 7.5 mg via INTRAVENOUS
  Filled 2020-01-20: qty 10

## 2020-01-21 ENCOUNTER — Encounter
Admission: RE | Disposition: A | Discharge status: Home / Self Care | Payer: Self-pay | Source: Home / Self Care | Attending: Surgery

## 2020-01-21 ENCOUNTER — Other Ambulatory Visit: Payer: Self-pay

## 2020-01-21 ENCOUNTER — Ambulatory Visit: Payer: 59 | Admitting: Anesthesiology

## 2020-01-21 ENCOUNTER — Encounter: Payer: Self-pay | Admitting: Surgery

## 2020-01-21 ENCOUNTER — Ambulatory Visit
Admission: RE | Admit: 2020-01-21 | Discharge: 2020-01-21 | Disposition: A | Discharge status: Home / Self Care | Payer: 59 | Attending: Surgery | Admitting: Surgery

## 2020-01-21 DIAGNOSIS — K8020 Calculus of gallbladder without cholecystitis without obstruction: Secondary | ICD-10-CM

## 2020-01-21 DIAGNOSIS — F17210 Nicotine dependence, cigarettes, uncomplicated: Secondary | ICD-10-CM | POA: Insufficient documentation

## 2020-01-21 DIAGNOSIS — I10 Essential (primary) hypertension: Secondary | ICD-10-CM | POA: Diagnosis not present

## 2020-01-21 DIAGNOSIS — K8010 Calculus of gallbladder with chronic cholecystitis without obstruction: Secondary | ICD-10-CM | POA: Diagnosis present

## 2020-01-21 DIAGNOSIS — Z79899 Other long term (current) drug therapy: Secondary | ICD-10-CM | POA: Diagnosis not present

## 2020-01-21 SURGERY — CHOLECYSTECTOMY, ROBOT-ASSISTED, LAPAROSCOPIC
Anesthesia: General | Site: Abdomen

## 2020-01-21 MED ORDER — FENTANYL CITRATE (PF) 100 MCG/2ML IJ SOLN
INTRAMUSCULAR | Status: DC | PRN
  Administered 2020-01-21 (×2): 25 ug via INTRAVENOUS
  Administered 2020-01-21: 50 ug via INTRAVENOUS

## 2020-01-21 MED ORDER — OXYCODONE HCL 5 MG PO TABS: 5.0000 mg | ORAL_TABLET | Freq: Once | ORAL | Status: AC | PRN

## 2020-01-21 MED ORDER — PHENYLEPHRINE HCL (PRESSORS) 10 MG/ML IV SOLN
INTRAVENOUS | Status: DC | PRN
  Administered 2020-01-21: 100 ug via INTRAVENOUS

## 2020-01-21 MED ORDER — KETOROLAC TROMETHAMINE 30 MG/ML IJ SOLN
INTRAMUSCULAR | Status: DC | PRN
  Administered 2020-01-21: 30 mg via INTRAVENOUS

## 2020-01-21 MED ORDER — CHLORHEXIDINE GLUCONATE CLOTH 2 % EX PADS
6.0000 | MEDICATED_PAD | Freq: Once | CUTANEOUS | Status: AC
  Administered 2020-01-21: 10:00:00 6 via TOPICAL

## 2020-01-21 MED ORDER — OXYCODONE HCL 5 MG PO TABS
ORAL_TABLET | ORAL | Status: AC
  Administered 2020-01-21: 15:00:00 5 mg via ORAL
  Filled 2020-01-21: qty 1

## 2020-01-21 MED ORDER — CELECOXIB 200 MG PO CAPS
ORAL_CAPSULE | ORAL | Status: AC
  Administered 2020-01-21: 10:00:00 200 mg via ORAL
  Filled 2020-01-21: qty 1

## 2020-01-21 MED ORDER — SUGAMMADEX SODIUM 200 MG/2ML IV SOLN
INTRAVENOUS | Status: DC | PRN
  Administered 2020-01-21: 200 mg via INTRAVENOUS

## 2020-01-21 MED ORDER — FENTANYL CITRATE (PF) 100 MCG/2ML IJ SOLN
25.0000 ug | INTRAMUSCULAR | Status: DC | PRN
  Administered 2020-01-21 (×3): 25 ug via INTRAVENOUS

## 2020-01-21 MED ORDER — CEFAZOLIN SODIUM-DEXTROSE 2-4 GM/100ML-% IV SOLN
INTRAVENOUS | Status: AC
  Filled 2020-01-21: qty 100

## 2020-01-21 MED ORDER — DEXAMETHASONE SODIUM PHOSPHATE 10 MG/ML IJ SOLN
INTRAMUSCULAR | Status: DC | PRN
  Administered 2020-01-21: 10 mg via INTRAVENOUS

## 2020-01-21 MED ORDER — PROPOFOL 10 MG/ML IV BOLUS
INTRAVENOUS | Status: DC | PRN
  Administered 2020-01-21: 150 mg via INTRAVENOUS

## 2020-01-21 MED ORDER — GABAPENTIN 300 MG PO CAPS
ORAL_CAPSULE | ORAL | Status: AC
  Administered 2020-01-21: 300 mg via ORAL
  Filled 2020-01-21: qty 1

## 2020-01-21 MED ORDER — ROCURONIUM BROMIDE 100 MG/10ML IV SOLN
INTRAVENOUS | Status: DC | PRN
  Administered 2020-01-21: 60 mg via INTRAVENOUS

## 2020-01-21 MED ORDER — CELECOXIB 200 MG PO CAPS: 200.0000 mg | ORAL_CAPSULE | ORAL | Status: AC

## 2020-01-21 MED ORDER — LIDOCAINE HCL (CARDIAC) PF 100 MG/5ML IV SOSY
PREFILLED_SYRINGE | INTRAVENOUS | Status: DC | PRN
  Administered 2020-01-21: 100 mg via INTRAVENOUS

## 2020-01-21 MED ORDER — GABAPENTIN 300 MG PO CAPS: 300.0000 mg | ORAL_CAPSULE | ORAL | Status: AC

## 2020-01-21 MED ORDER — BUPIVACAINE-EPINEPHRINE (PF) 0.25% -1:200000 IJ SOLN
INTRAMUSCULAR | Status: AC
  Filled 2020-01-21: qty 30

## 2020-01-21 MED ORDER — LACTATED RINGERS IV SOLN: INTRAVENOUS | Status: DC

## 2020-01-21 MED ORDER — CEFAZOLIN SODIUM-DEXTROSE 2-4 GM/100ML-% IV SOLN
2.0000 g | INTRAVENOUS | Status: AC
  Administered 2020-01-21: 12:00:00 2 g via INTRAVENOUS

## 2020-01-21 MED ORDER — ONDANSETRON HCL 4 MG/2ML IJ SOLN
INTRAMUSCULAR | Status: DC | PRN
  Administered 2020-01-21: 4 mg via INTRAVENOUS

## 2020-01-21 MED ORDER — FENTANYL CITRATE (PF) 100 MCG/2ML IJ SOLN
INTRAMUSCULAR | Status: AC
  Administered 2020-01-21: 14:00:00 25 ug via INTRAVENOUS
  Filled 2020-01-21: qty 2

## 2020-01-21 MED ORDER — BUPIVACAINE LIPOSOME 1.3 % IJ SUSP
INTRAMUSCULAR | Status: DC | PRN
  Administered 2020-01-21: 20 mL

## 2020-01-21 MED ORDER — HYDROCODONE-ACETAMINOPHEN 5-325 MG PO TABS: 1.0000 | ORAL_TABLET | ORAL | 0 refills | Status: DC | PRN

## 2020-01-21 MED ORDER — OXYCODONE-ACETAMINOPHEN 5-325 MG PO TABS
ORAL_TABLET | ORAL | Status: AC
  Filled 2020-01-21: qty 1

## 2020-01-21 MED ORDER — DEXMEDETOMIDINE HCL 200 MCG/2ML IV SOLN
INTRAVENOUS | Status: DC | PRN
  Administered 2020-01-21: 12 ug via INTRAVENOUS

## 2020-01-21 MED ORDER — MIDAZOLAM HCL 2 MG/2ML IJ SOLN
INTRAMUSCULAR | Status: AC
  Filled 2020-01-21: qty 2

## 2020-01-21 MED ORDER — ACETAMINOPHEN 500 MG PO TABS: 1000.0000 mg | ORAL_TABLET | ORAL | Status: AC

## 2020-01-21 MED ORDER — CHLORHEXIDINE GLUCONATE CLOTH 2 % EX PADS
6.0000 | MEDICATED_PAD | Freq: Once | CUTANEOUS | Status: AC
  Administered 2020-01-21: 6 via TOPICAL

## 2020-01-21 MED ORDER — MIDAZOLAM HCL 2 MG/2ML IJ SOLN
INTRAMUSCULAR | Status: DC | PRN
  Administered 2020-01-21: 2 mg via INTRAVENOUS

## 2020-01-21 MED ORDER — ACETAMINOPHEN 500 MG PO TABS
ORAL_TABLET | ORAL | Status: AC
  Administered 2020-01-21: 10:00:00 1000 mg via ORAL
  Filled 2020-01-21: qty 2

## 2020-01-21 MED ORDER — BUPIVACAINE-EPINEPHRINE (PF) 0.25% -1:200000 IJ SOLN
INTRAMUSCULAR | Status: DC | PRN
  Administered 2020-01-21: 30 mL

## 2020-01-21 MED ORDER — DEXMEDETOMIDINE HCL IN NACL 80 MCG/20ML IV SOLN
INTRAVENOUS | Status: AC
  Filled 2020-01-21: qty 20

## 2020-01-21 MED ORDER — OXYCODONE HCL 5 MG/5ML PO SOLN: 5.0000 mg | Freq: Once | ORAL | Status: AC | PRN

## 2020-01-21 MED ORDER — FENTANYL CITRATE (PF) 100 MCG/2ML IJ SOLN
INTRAMUSCULAR | Status: AC
  Filled 2020-01-21: qty 2

## 2020-01-21 MED ORDER — BUPIVACAINE LIPOSOME 1.3 % IJ SUSP
INTRAMUSCULAR | Status: AC
  Filled 2020-01-21: qty 20

## 2020-01-21 SURGICAL SUPPLY — 52 items
BLADE CLIPPER SURG (BLADE) ×2 IMPLANT
CANISTER SUCT 1200ML W/VALVE (MISCELLANEOUS) ×3 IMPLANT
CANNULA REDUC XI 12-8 STAPL (CANNULA) ×1
CANNULA REDUC XI 12-8MM STAPL (CANNULA) ×1
CANNULA REDUCER 12-8 DVNC XI (CANNULA) IMPLANT
CHLORAPREP W/TINT 26 (MISCELLANEOUS) ×3 IMPLANT
CLIP VESOLOCK MED LG 6/CT (CLIP) ×3 IMPLANT
COVER WAND RF STERILE (DRAPES) ×3 IMPLANT
DECANTER SPIKE VIAL GLASS SM (MISCELLANEOUS) ×3 IMPLANT
DEFOGGER SCOPE WARMER CLEARIFY (MISCELLANEOUS) ×3 IMPLANT
DERMABOND ADVANCED (GAUZE/BANDAGES/DRESSINGS) ×2
DERMABOND ADVANCED .7 DNX12 (GAUZE/BANDAGES/DRESSINGS) ×1 IMPLANT
DRAPE ARM DVNC X/XI (DISPOSABLE) ×4 IMPLANT
DRAPE COLUMN DVNC XI (DISPOSABLE) ×1 IMPLANT
DRAPE DA VINCI XI ARM (DISPOSABLE) ×8
DRAPE DA VINCI XI COLUMN (DISPOSABLE) ×2
ELECT CAUTERY BLADE 6.4 (BLADE) ×3 IMPLANT
ELECT REM PT RETURN 9FT ADLT (ELECTROSURGICAL) ×3
ELECTRODE REM PT RTRN 9FT ADLT (ELECTROSURGICAL) ×1 IMPLANT
GLOVE BIO SURGEON STRL SZ7 (GLOVE) ×6 IMPLANT
GOWN STRL REUS W/ TWL LRG LVL3 (GOWN DISPOSABLE) ×4 IMPLANT
GOWN STRL REUS W/TWL LRG LVL3 (GOWN DISPOSABLE) ×8
IRRIGATION STRYKERFLOW (MISCELLANEOUS) IMPLANT
IRRIGATOR STRYKERFLOW (MISCELLANEOUS)
IV NS 1000ML (IV SOLUTION)
IV NS 1000ML BAXH (IV SOLUTION) IMPLANT
KIT PINK PAD W/HEAD ARE REST (MISCELLANEOUS) ×3
KIT PINK PAD W/HEAD ARM REST (MISCELLANEOUS) ×1 IMPLANT
LABEL OR SOLS (LABEL) ×3 IMPLANT
NEEDLE HYPO 22GX1.5 SAFETY (NEEDLE) ×3 IMPLANT
NS IRRIG 500ML POUR BTL (IV SOLUTION) ×3 IMPLANT
OBTURATOR OPTICAL STANDARD 8MM (TROCAR) ×2
OBTURATOR OPTICAL STND 8 DVNC (TROCAR) ×1
OBTURATOR OPTICALSTD 8 DVNC (TROCAR) ×1 IMPLANT
PACK LAP CHOLECYSTECTOMY (MISCELLANEOUS) ×3 IMPLANT
PENCIL ELECTRO HAND CTR (MISCELLANEOUS) ×3 IMPLANT
POUCH SPECIMEN RETRIEVAL 10MM (ENDOMECHANICALS) ×3 IMPLANT
SEAL CANN UNIV 5-8 DVNC XI (MISCELLANEOUS) ×4 IMPLANT
SEAL XI 5MM-8MM UNIVERSAL (MISCELLANEOUS) ×8
SET TUBE SMOKE EVAC HIGH FLOW (TUBING) ×3 IMPLANT
SOLUTION ELECTROLUBE (MISCELLANEOUS) ×3 IMPLANT
SPONGE LAP 18X18 RF (DISPOSABLE) ×3 IMPLANT
SPONGE LAP 4X18 RFD (DISPOSABLE) ×2 IMPLANT
STAPLER CANNULA SEAL DVNC XI (STAPLE) IMPLANT
STAPLER CANNULA SEAL XI (STAPLE) ×2
SUT MNCRL 4-0 (SUTURE) ×2
SUT MNCRL 4-0 27XMFL (SUTURE) ×1
SUT MNCRL AB 4-0 PS2 18 (SUTURE) ×3 IMPLANT
SUT VICRYL 0 AB UR-6 (SUTURE) ×6 IMPLANT
SUTURE MNCRL 4-0 27XMF (SUTURE) IMPLANT
TAPE TRANSPORE STRL 2 31045 (GAUZE/BANDAGES/DRESSINGS) ×2 IMPLANT
TROCAR 130MM GELPORT  DAV (MISCELLANEOUS) ×3 IMPLANT

## 2020-01-21 NOTE — Anesthesia Preprocedure Evaluation (Signed)
Anesthesia Evaluation  Patient identified by MRN, date of birth, ID band Patient awake    Reviewed: Allergy & Precautions, H&P , NPO status , Patient's Chart, lab work & pertinent test results  Airway Mallampati: II  TM Distance: >3 FB Neck ROM: full    Dental  (+) Upper Dentures, Lower Dentures   Pulmonary Current Smoker and Patient abstained from smoking.,     + decreased breath sounds      Cardiovascular hypertension,  Rhythm:regular Rate:Normal     Neuro/Psych negative neurological ROS  negative psych ROS   GI/Hepatic Neg liver ROS, GERD  ,Gallstones diverticulosis   Endo/Other  negative endocrine ROS  Renal/GU      Musculoskeletal   Abdominal   Peds  Hematology negative hematology ROS (+)   Anesthesia Other Findings Past Medical History: No date: Diverticulitis No date: Gallbladder disease No date: GERD (gastroesophageal reflux disease) No date: History of colonoscopy No date: History of hiatal hernia No date: Hypertension  Past Surgical History: 01/11/2020: COLONOSCOPY WITH PROPOFOL; N/A     Comment:  Procedure: COLONOSCOPY WITH PROPOFOL;  Surgeon: Jonathon Bellows, MD;  Location: [facility] ENDOSCOPY;  Service:               Gastroenterology;  Laterality: N/A; No date: HERNIA REPAIR; Right     Reproductive/Obstetrics negative OB ROS                             Anesthesia Physical Anesthesia Plan  ASA: II  Anesthesia Plan: General ETT   Post-op Pain Management:    Induction:   PONV Risk Score and Plan: Ondansetron, Dexamethasone, Midazolam and Treatment may vary due to age or medical condition  Airway Management Planned:   Additional Equipment:   Intra-op Plan:   Post-operative Plan:   Informed Consent: I have reviewed the patients History and Physical, chart, labs and discussed the procedure including the risks, benefits and alternatives for the proposed  anesthesia with the patient or authorized representative who has indicated his/her understanding and acceptance.     Dental Advisory Given  Plan Discussed with: Anesthesiologist  Anesthesia Plan Comments:         Anesthesia Quick Evaluation

## 2020-01-21 NOTE — Op Note (Signed)
Robotic assisted laparoscopic Cholecystectomy  Pre-operative Diagnosis: chronic cholcystitis  Post-operative Diagnosis: same  Procedure:  Robotic assisted laparoscopic Cholecystectomy  Surgeon: Caroleen Hamman, MD FACS  Anesthesia: Gen. with endotracheal tube  Findings: Chronic Cholecystitis W cholelithiasis  Estimated Blood Loss: 5cc       Specimens: Gallbladder           Complications: none   Procedure Details  The patient was seen again in the Holding Room. The benefits, complications, treatment options, and expected outcomes were discussed with the patient. The risks of bleeding, infection, recurrence of symptoms, failure to resolve symptoms, bile duct damage, bile duct leak, retained common bile duct stone, bowel injury, any of which could require further surgery and/or ERCP, stent, or papillotomy were reviewed with the patient. The likelihood of improving the patient's symptoms with return to their baseline status is good.  The patient and/or family concurred with the proposed plan, giving informed consent.  The patient was taken to Operating Room, identified  and the procedure verified as Laparoscopic Cholecystectomy.  A Time Out was held and the above information confirmed.  Prior to the induction of general anesthesia, antibiotic prophylaxis was administered. VTE prophylaxis was in place. General endotracheal anesthesia was then administered and tolerated well. After the induction, the abdomen was prepped with Chloraprep and draped in the sterile fashion. The patient was positioned in the supine position.  Cut down technique was used to enter the abdominal cavity and a Hasson trochar was placed after two vicryl stitches were anchored to the fascia. Pneumoperitoneum was then created with CO2 and tolerated well without any adverse changes in the patient's vital signs.  Three 8-mm ports were placed under direct vision. All skin incisions  were infiltrated with a local anesthetic agent  before making the incision and placing the trocars.   The patient was positioned  in reverse Trendelenburg, robot was brought to the surgical field and docked in the standard fashion.  We made sure all the instrumentation was kept indirect view at all times and that there were no collision between the arms. I scrubbed out and went to the console.  The gallbladder was identified, the fundus grasped and retracted cephalad. Adhesions were lysed bluntly. The infundibulum was grasped and retracted laterally, exposing the peritoneum overlying the triangle of Calot. This was then divided and exposed in a blunt fashion. An extended critical view of the cystic duct and cystic artery was obtained.  The cystic duct was clearly identified and bluntly dissected.   Artery and duct were double clipped and divided. Using ICG cholangiography we visualize the cystic duct and the biliary anatomy, no evidence of bile injuries observed. The gallbladder was taken from the gallbladder fossa in a retrograde fashion with the electrocautery.  Hemostasis was achieved with the electrocautery. nspection of the right upper quadrant was performed. No bleeding, bile duct injury or leak, or bowel injury was noted. Robotic instruments and robotic arms were undocked in the standard fashion.  I scrubbed back in.  The gallbladder was removed and placed in an Endocatch bag.   Pneumoperitoneum was released.  The periumbilical port site was closed with interrumpted 0 Vicryl sutures. 4-0 subcuticular Monocryl was used to close the skin. Dermabond was  applied.  The patient was then extubated and brought to the recovery room in stable condition. Sponge, lap, and needle counts were correct at closure and at the conclusion of the case.               O'Neill  Dahlia Byes, MD, FACS

## 2020-01-21 NOTE — Discharge Instructions (Signed)

## 2020-01-21 NOTE — Anesthesia Procedure Notes (Signed)
Procedure Name: Intubation Date/Time: 01/21/2020 12:08 PM Performed by: Justus Memory, CRNA Pre-anesthesia Checklist: Patient identified, Patient being monitored, Timeout performed, Emergency Drugs available and Suction available Patient Re-evaluated:Patient Re-evaluated prior to induction Oxygen Delivery Method: Circle system utilized Preoxygenation: Pre-oxygenation with 100% oxygen Induction Type: IV induction Ventilation: Mask ventilation without difficulty Laryngoscope Size: McGraph and 4 Grade View: Grade I Tube type: Oral Tube size: 7.0 mm Number of attempts: 1 Airway Equipment and Method: Stylet and Video-laryngoscopy Placement Confirmation: ETT inserted through vocal cords under direct vision,  positive ETCO2 and breath sounds checked- equal and bilateral Secured at: 21 cm Tube secured with: Tape Dental Injury: Teeth and Oropharynx as per pre-operative assessment

## 2020-01-21 NOTE — Interval H&P Note (Signed)
History and Physical Interval Note:  01/21/2020 11:36 AM  Maurice Miranda  has presented today for surgery, with the diagnosis of Biliary colic.  The various methods of treatment have been discussed with the patient and family. After consideration of risks, benefits and other options for treatment, the patient has consented to  Procedure(s): XI ROBOTIC [place] (N/A) as a surgical intervention.  The patient's history has been reviewed, patient examined, no change in status, stable for surgery.  I have reviewed the patient's chart and labs.  Questions were answered to the patient's satisfaction.     Perkins

## 2020-01-21 NOTE — Transfer of Care (Signed)
Immediate Anesthesia Transfer of Care Note  Patient: Maurice Miranda  Procedure(s) Performed: XI ROBOTIC ASSISTED LAPAROSCOPIC CHOLECYSTECTOMY (N/A Abdomen)  Patient Location: PACU  Anesthesia Type:General  Level of Consciousness: sedated  Airway & Oxygen Therapy: Patient Spontanous Breathing and Patient connected to face mask oxygen  Post-op Assessment: Report given to RN and Post -op Vital signs reviewed and stable  Post vital signs: Reviewed and stable  Last Vitals:  Vitals Value Taken Time  BP 116/78 01/21/20 1347  Temp 36.4 C 01/21/20 1347  Pulse 65 01/21/20 1359  Resp 16 01/21/20 1359  SpO2 99 % 01/21/20 1359  Vitals shown include unvalidated device data.  Last Pain:  Vitals:   01/21/20 1347  TempSrc:   PainSc: Asleep         Complications: No apparent anesthesia complications

## 2020-01-22 NOTE — Anesthesia Postprocedure Evaluation (Signed)
Anesthesia Post Note  Patient: Maurice Miranda  Procedure(s) Performed: XI ROBOTIC ASSISTED LAPAROSCOPIC CHOLECYSTECTOMY (N/A Abdomen)  Patient location during evaluation: PACU Anesthesia Type: General Level of consciousness: awake and alert Pain management: pain level controlled Vital Signs Assessment: post-procedure vital signs reviewed and stable Respiratory status: spontaneous breathing, nonlabored ventilation, respiratory function stable and patient connected to nasal cannula oxygen Cardiovascular status: blood pressure returned to baseline and stable Postop Assessment: no apparent nausea or vomiting Anesthetic complications: no     Last Vitals:  Vitals:   01/21/20 1432 01/21/20 1442  BP:  109/78  Pulse: (!) 59 91  Resp: 20 17  Temp:  (!) 36 C  SpO2: 100% 100%    Last Pain:  Vitals:   01/21/20 1442  TempSrc: Tympanic  PainSc: 7                  Diontae Route Harvie Heck

## 2020-01-25 ENCOUNTER — Telehealth: Payer: Self-pay

## 2020-01-25 LAB — SURGICAL PATHOLOGY

## 2020-01-25 NOTE — Telephone Encounter (Signed)
-----   Message from Jonathon Bellows, MD sent at 01/19/2020  1:38 PM EDT ----- Polyps resected were adenomas i.e. precancerous.  Since prep was poor needs a repeat procedure with 2-day prep or longer

## 2020-01-25 NOTE — Telephone Encounter (Signed)
Called pt to inform him of biopsy result and Dr. Georgeann Oppenheim recommendation.  Unable to contact, LVM to return call

## 2020-01-26 NOTE — Telephone Encounter (Signed)
May do colonoscopy at any time.

## 2020-01-26 NOTE — Telephone Encounter (Signed)
Dr Dahlia Byes how long after his cholecystectomy can we do his colonoscopy?   Bailey Mech

## 2020-01-26 NOTE — Telephone Encounter (Signed)
Spoke with pt and informed him of biopsy results and Dr. Georgeann Oppenheim recommendation. Pt states he wants to proceed with the repeat colonoscopy as soon as possible so that he's able to get back to work but pt just had his gallbladder removed last week. I explained that I will ask Dr. Vicente Males how long pt has to recover before proceeding with the colonoscopy.

## 2020-01-27 NOTE — Telephone Encounter (Signed)
Please schedule

## 2020-01-28 ENCOUNTER — Other Ambulatory Visit: Payer: Self-pay

## 2020-01-28 ENCOUNTER — Telehealth: Payer: Self-pay | Admitting: Gastroenterology

## 2020-01-28 DIAGNOSIS — K5732 Diverticulitis of large intestine without perforation or abscess without bleeding: Secondary | ICD-10-CM

## 2020-01-28 NOTE — Telephone Encounter (Signed)
Patient called an ask if Maurice Miranda would give him a call.

## 2020-01-29 NOTE — Telephone Encounter (Signed)
Spoke with Maurice Miranda and was able to schedule procedure on 02-08-20. Maurice Miranda requests Dr. Georgeann Oppenheim advice. Maurice Miranda states he's still very constipated he's been drinking small amounts of  magnesium citrate and taking stool softeners, and Maurice Miranda is now taking miralax.

## 2020-01-31 NOTE — Telephone Encounter (Signed)
Stop miralax and softners and start on Trulance - give samples

## 2020-02-01 NOTE — Telephone Encounter (Signed)
Spoke with pt and informed him of Dr. Georgeann Oppenheim recommendation. Pt agrees to try the Trulance and plans to pick up samples from our office on Thursday this week.

## 2020-02-03 ENCOUNTER — Ambulatory Visit (INDEPENDENT_AMBULATORY_CARE_PROVIDER_SITE_OTHER): Payer: Self-pay | Admitting: Surgery

## 2020-02-03 ENCOUNTER — Other Ambulatory Visit: Payer: Self-pay

## 2020-02-03 ENCOUNTER — Encounter: Payer: Self-pay | Admitting: Surgery

## 2020-02-03 ENCOUNTER — Encounter: Payer: 59 | Admitting: Surgery

## 2020-02-03 VITALS — BP 121/79 | HR 70 | Temp 97.9°F | Resp 12 | Ht 70.0 in | Wt 175.8 lb

## 2020-02-03 DIAGNOSIS — Z09 Encounter for follow-up examination after completed treatment for conditions other than malignant neoplasm: Secondary | ICD-10-CM

## 2020-02-03 NOTE — Patient Instructions (Addendum)
Follow up as needed. Call the office if you have any questions or concerns.   GENERAL POST-OPERATIVE PATIENT INSTRUCTIONS   WOUND CARE INSTRUCTIONS:  Keep a dry clean dressing on the wound if there is drainage. The initial bandage may be removed after 24 hours.  Once the wound has quit draining you may leave it open to air.  If clothing rubs against the wound or causes irritation and the wound is not draining you may cover it with a dry dressing during the daytime.  Try to keep the wound dry and avoid ointments on the wound unless directed to do so.  If the wound becomes bright red and painful or starts to drain infected material that is not clear, please contact your physician immediately.  If the wound is mildly pink and has a thick firm ridge underneath it, this is normal, and is referred to as a healing ridge.  This will resolve over the next 4-6 weeks.  BATHING: You may shower if you have been informed of this by your surgeon. However, Please do not submerge in a tub, hot tub, or pool until incisions are completely sealed or have been told by your surgeon that you may do so.  DIET:  You may eat any foods that you can tolerate.  It is a good idea to eat a high fiber diet and take in plenty of fluids to prevent constipation.  If you do become constipated you may want to take a mild laxative or take ducolax tablets on a daily basis until your bowel habits are regular.  Constipation can be very uncomfortable, along with straining, after recent surgery.  ACTIVITY:  You are encouraged to cough and deep breath or use your incentive spirometer if you were given one, every 15-30 minutes when awake.  This will help prevent respiratory complications and low grade fevers post-operatively if you had a general anesthetic.  You may want to hug a pillow when coughing and sneezing to add additional support to the surgical area, if you had abdominal or chest surgery, which will decrease pain during these times.  You  are encouraged to walk and engage in light activity for the next two weeks.  You should not lift more than 20 pounds, until 02/18/20 as it could put you at increased risk for complications.  Twenty pounds is roughly equivalent to a plastic bag of groceries. At that time- Listen to your body when lifting, if you have pain when lifting, stop and then try again in a few days. Soreness after doing exercises or activities of daily living is normal as you get back in to your normal routine.  MEDICATIONS:  Try to take narcotic medications and anti-inflammatory medications, such as tylenol, ibuprofen, naprosyn, etc., with food.  This will minimize stomach upset from the medication.  Should you develop nausea and vomiting from the pain medication, or develop a rash, please discontinue the medication and contact your physician.  You should not drive, make important decisions, or operate machinery when taking narcotic pain medication.  SUNBLOCK Use sun block to incision area over the next year if this area will be exposed to sun. This helps decrease scarring and will allow you avoid a permanent darkened area over your incision.  QUESTIONS:  Please feel free to call our office if you have any questions, and we will be glad to assist you.

## 2020-02-03 NOTE — Progress Notes (Signed)
S/p rob lap chole  Doing well Path d/w pt + PO, + BM Hx diverticulosis diverticulitis and colon polyp for colonoscopy next week  PE NAD Abd: soft, incisions c/d/i. No infection\  A/pDoing well RTC prn

## 2020-02-04 ENCOUNTER — Other Ambulatory Visit
Admission: RE | Admit: 2020-02-04 | Discharge: 2020-02-04 | Disposition: A | Discharge status: Home / Self Care | Payer: 59 | Source: Ambulatory Visit | Attending: Gastroenterology | Admitting: Gastroenterology

## 2020-02-04 DIAGNOSIS — Z20822 Contact with and (suspected) exposure to covid-19: Secondary | ICD-10-CM | POA: Diagnosis not present

## 2020-02-04 DIAGNOSIS — Z01812 Encounter for preprocedural laboratory examination: Secondary | ICD-10-CM | POA: Insufficient documentation

## 2020-02-04 LAB — SARS CORONAVIRUS 2 (TAT 6-24 HRS): SARS Coronavirus 2: NEGATIVE

## 2020-02-08 ENCOUNTER — Ambulatory Visit
Admission: RE | Admit: 2020-02-08 | Discharge: 2020-02-08 | Disposition: A | Discharge status: Home / Self Care | Payer: 59 | Attending: Gastroenterology | Admitting: Gastroenterology

## 2020-02-08 ENCOUNTER — Ambulatory Visit: Payer: 59 | Admitting: Anesthesiology

## 2020-02-08 ENCOUNTER — Other Ambulatory Visit: Payer: Self-pay

## 2020-02-08 ENCOUNTER — Encounter
Admission: RE | Disposition: A | Discharge status: Home / Self Care | Payer: Self-pay | Source: Home / Self Care | Attending: Gastroenterology

## 2020-02-08 DIAGNOSIS — K5732 Diverticulitis of large intestine without perforation or abscess without bleeding: Secondary | ICD-10-CM | POA: Diagnosis not present

## 2020-02-08 DIAGNOSIS — D120 Benign neoplasm of cecum: Secondary | ICD-10-CM | POA: Diagnosis not present

## 2020-02-08 DIAGNOSIS — Z79899 Other long term (current) drug therapy: Secondary | ICD-10-CM | POA: Insufficient documentation

## 2020-02-08 DIAGNOSIS — K5730 Diverticulosis of large intestine without perforation or abscess without bleeding: Secondary | ICD-10-CM | POA: Insufficient documentation

## 2020-02-08 DIAGNOSIS — D123 Benign neoplasm of transverse colon: Secondary | ICD-10-CM | POA: Diagnosis not present

## 2020-02-08 DIAGNOSIS — Z7982 Long term (current) use of aspirin: Secondary | ICD-10-CM | POA: Diagnosis not present

## 2020-02-08 DIAGNOSIS — Z8379 Family history of other diseases of the digestive system: Secondary | ICD-10-CM | POA: Diagnosis not present

## 2020-02-08 DIAGNOSIS — K219 Gastro-esophageal reflux disease without esophagitis: Secondary | ICD-10-CM | POA: Diagnosis not present

## 2020-02-08 DIAGNOSIS — K5792 Diverticulitis of intestine, part unspecified, without perforation or abscess without bleeding: Secondary | ICD-10-CM | POA: Diagnosis present

## 2020-02-08 DIAGNOSIS — K635 Polyp of colon: Secondary | ICD-10-CM

## 2020-02-08 DIAGNOSIS — R519 Headache, unspecified: Secondary | ICD-10-CM | POA: Diagnosis not present

## 2020-02-08 DIAGNOSIS — K449 Diaphragmatic hernia without obstruction or gangrene: Secondary | ICD-10-CM | POA: Insufficient documentation

## 2020-02-08 DIAGNOSIS — I10 Essential (primary) hypertension: Secondary | ICD-10-CM | POA: Diagnosis not present

## 2020-02-08 DIAGNOSIS — D122 Benign neoplasm of ascending colon: Secondary | ICD-10-CM | POA: Insufficient documentation

## 2020-02-08 DIAGNOSIS — K621 Rectal polyp: Secondary | ICD-10-CM | POA: Diagnosis not present

## 2020-02-08 DIAGNOSIS — F17210 Nicotine dependence, cigarettes, uncomplicated: Secondary | ICD-10-CM | POA: Diagnosis not present

## 2020-02-08 HISTORY — PX: COLONOSCOPY WITH PROPOFOL: SHX5780

## 2020-02-08 SURGERY — COLONOSCOPY WITH PROPOFOL
Anesthesia: General

## 2020-02-08 MED ORDER — PROPOFOL 500 MG/50ML IV EMUL
INTRAVENOUS | Status: AC
  Filled 2020-02-08: qty 50

## 2020-02-08 MED ORDER — KETOROLAC TROMETHAMINE 30 MG/ML IJ SOLN
INTRAMUSCULAR | Status: AC
  Filled 2020-02-08: qty 1

## 2020-02-08 MED ORDER — PROPOFOL 500 MG/50ML IV EMUL
INTRAVENOUS | Status: DC | PRN
  Administered 2020-02-08: 100 ug/kg/min via INTRAVENOUS

## 2020-02-08 MED ORDER — MIDAZOLAM HCL 2 MG/2ML IJ SOLN
INTRAMUSCULAR | Status: DC | PRN
  Administered 2020-02-08 (×2): 1 mg via INTRAVENOUS

## 2020-02-08 MED ORDER — LIDOCAINE HCL (PF) 1 % IJ SOLN
INTRAMUSCULAR | Status: AC
  Filled 2020-02-08: qty 2

## 2020-02-08 MED ORDER — KETOROLAC TROMETHAMINE 30 MG/ML IJ SOLN
INTRAMUSCULAR | Status: DC | PRN
  Administered 2020-02-08: 30 mg via INTRAVENOUS

## 2020-02-08 MED ORDER — MIDAZOLAM HCL 2 MG/2ML IJ SOLN
INTRAMUSCULAR | Status: AC
  Filled 2020-02-08: qty 2

## 2020-02-08 MED ORDER — LIDOCAINE HCL (CARDIAC) PF 100 MG/5ML IV SOSY
PREFILLED_SYRINGE | INTRAVENOUS | Status: DC | PRN
  Administered 2020-02-08: 50 mg via INTRAVENOUS

## 2020-02-08 MED ORDER — PROPOFOL 10 MG/ML IV BOLUS
INTRAVENOUS | Status: DC | PRN
  Administered 2020-02-08: 80 mg via INTRAVENOUS

## 2020-02-08 MED ORDER — SODIUM CHLORIDE 0.9 % IV SOLN
INTRAVENOUS | Status: DC
  Administered 2020-02-08: 1000 mL via INTRAVENOUS

## 2020-02-08 NOTE — Op Note (Signed)
[facility] Gastroenterology Patient Name: Maurice Miranda Procedure Date: 02/08/2020 8:28 AM MRN: [id number] Account #: [id number] Date of Birth: 1956-04-14 Admit Type: Outpatient Age: 64 Room: [facility] ENDO ROOM 4 Gender: Male Note Status: Finalized Procedure:             Colonoscopy Indications:           Follow-up of diverticulitis Providers:             Jonathon Bellows MD, MD Referring MD:          Suzan Garibaldi (Referring MD) Medicines:             Monitored Anesthesia Care Complications:         No immediate complications. Procedure:             Pre-Anesthesia Assessment:                        - Prior to the procedure, a History and Physical was                         performed, and patient medications, allergies and                         sensitivities were reviewed. The patient's tolerance                         of previous anesthesia was reviewed.                        - The risks and benefits of the procedure and the                         sedation options and risks were discussed with the                         patient. All questions were answered and informed                         consent was obtained.                        - ASA Grade Assessment: II - A patient with mild                         systemic disease.                        After obtaining informed consent, the colonoscope was                         passed under direct vision. Throughout the procedure,                         the patient's blood pressure, pulse, and oxygen                         saturations were monitored continuously. The                         Colonoscope was introduced through the anus and  advanced to the the cecum, identified by the                         appendiceal orifice. The colonoscopy was performed                         with ease. The patient tolerated the procedure well.                         The quality of the bowel preparation  was good. Findings:      The perianal and digital rectal examinations were normal.      A 4 mm polyp was found in the cecum. The polyp was sessile. The polyp       was removed with a cold biopsy forceps. Resection and retrieval were       complete.      Seven sessile polyps were found in the ascending colon. The polyps were       4 to 7 mm in size. These polyps were removed with a cold snare.       Resection and retrieval were complete.      Two sessile polyps were found in the transverse colon. The polyps were 5       to 10 mm in size. These polyps were removed with a cold snare. Resection       and retrieval were complete. To prevent bleeding after the polypectomy,       three hemostatic clips were successfully placed. There was no bleeding       during, or at the end, of the procedure.      Two sessile polyps were found in the rectum. The polyps were 8 to 10 mm       in size. These polyps were removed with a cold snare. Resection and       retrieval were complete.      The exam was otherwise without abnormality on direct and retroflexion       views.      Multiple small-mouthed diverticula were found in the transverse colon       and left colon.      The exam was otherwise without abnormality on direct and retroflexion       views. Impression:            - One 4 mm polyp in the cecum, removed with a cold                         biopsy forceps. Resected and retrieved.                        - Seven 4 to 7 mm polyps in the ascending colon,                         removed with a cold snare. Resected and retrieved.                        - Two 5 to 10 mm polyps in the transverse colon,                         removed with a cold snare. Resected and retrieved.  Clips were placed.                        - Two 8 to 10 mm polyps in the rectum, removed with a                         cold snare. Resected and retrieved.                        - The examination was  otherwise normal on direct and                         retroflexion views.                        - Diverticulosis in the transverse colon and in the                         left colon.                        - The examination was otherwise normal on direct and                         retroflexion views. Recommendation:        - Discharge patient to home (with escort).                        - Resume previous diet.                        - Continue present medications.                        - Await pathology results.                        - Repeat colonoscopy for surveillance based on                         pathology results. Procedure Code(s):     --- Professional ---                        [id number], Colonoscopy, flexible; with removal of                         tumor(s), polyp(s), or other lesion(s) by snare                         technique                        45380, 35, Colonoscopy, flexible; with biopsy, single                         or multiple Diagnosis Code(s):     --- Professional ---                        K63.5, Polyp of colon  K62.1, Rectal polyp                        K57.32, Diverticulitis of large intestine without                         perforation or abscess without bleeding                        K57.30, Diverticulosis of large intestine without                         perforation or abscess without bleeding CPT copyright 2019 [employer] Medical Association. All rights reserved. The codes documented in this report are preliminary and upon coder review may  be revised to meet current compliance requirements. Jonathon Bellows, MD Jonathon Bellows MD, MD 02/08/2020 9:30:10 AM This report has been signed electronically. Number of Addenda: 0 Note Initiated On: 02/08/2020 8:28 AM Scope Withdrawal Time: 0 hours 30 minutes 32 seconds  Total Procedure Duration: 0 hours 33 minutes 50 seconds  Estimated Blood Loss:  Estimated blood loss: none.      [facility]

## 2020-02-08 NOTE — Transfer of Care (Signed)
Immediate Anesthesia Transfer of Care Note  Patient: Maurice Miranda  Procedure(s) Performed: COLONOSCOPY WITH PROPOFOL (N/A )  Patient Location: Endoscopy Unit  Anesthesia Type:General  Level of Consciousness: drowsy and patient cooperative  Airway & Oxygen Therapy: Patient Spontanous Breathing  Post-op Assessment: Report given to RN and Post -op Vital signs reviewed and stable  Post vital signs: Reviewed and stable  Last Vitals:  Vitals Value Taken Time  BP 96/67 02/08/20 0932  Temp 36.1 C 02/08/20 0931  Pulse 59 02/08/20 0935  Resp 20 02/08/20 0935  SpO2 96 % 02/08/20 0935  Vitals shown include unvalidated device data.  Last Pain:  Vitals:   02/08/20 0931  TempSrc: Temporal  PainSc:          Complications: No apparent anesthesia complications

## 2020-02-08 NOTE — H&P (Signed)
Jonathon Bellows, MD [address] [place], [place], [place], [address] [address], [place], [place], [place], [address] Phone: [phone]  Fax: [phone]  Primary Care Physician:  Suzan Garibaldi, FNP   Pre-Procedure History & Physical: HPI:  Maurice Miranda is a 64 y.o. male is here for an colonoscopy.   Past Medical History:  Diagnosis Date  . Diverticulitis   . Gallbladder disease   . GERD (gastroesophageal reflux disease)   . History of colonoscopy   . History of hiatal hernia   . Hypertension     Past Surgical History:  Procedure Laterality Date  . COLONOSCOPY WITH PROPOFOL N/A 01/11/2020   Procedure: COLONOSCOPY WITH PROPOFOL;  Surgeon: Jonathon Bellows, MD;  Location: [facility] ENDOSCOPY;  Service: Gastroenterology;  Laterality: N/A;  . HERNIA REPAIR Right     Prior to Admission medications   Medication Sig Start Date End Date Taking? Authorizing Provider  albuterol (VENTOLIN HFA) 108 (90 Base) MCG/ACT inhaler Inhale 1-2 puffs into the lungs every 6 (six) hours as needed for wheezing or shortness of breath.  09/05/19  Yes [provider]  Aspirin-Caffeine 845-65 MG PACK Take 1 packet by mouth as needed.    [provider]  busPIRone (BUSPAR) 7.5 MG tablet Take 7.5 mg by mouth 2 (two) times daily. 12/08/19   [provider]  docusate sodium (COLACE) 100 MG capsule Take 200 mg by mouth daily.    [provider]  hydrOXYzine (VISTARIL) 25 MG capsule Take 25 mg by mouth 2 (two) times daily as needed for anxiety or itching.  09/30/19   [provider]  lisinopril-hydrochlorothiazide (ZESTORETIC) 20-12.5 MG tablet Take 1 tablet by mouth at bedtime.  09/24/19   [provider]  ondansetron (ZOFRAN ODT) 4 MG disintegrating tablet Take 1 tablet (4 mg total) by mouth every 8 (eight) hours as needed for nausea or vomiting. Patient taking differently: Take 4 mg by mouth daily. AND PRN 01/11/20   Blake Divine, MD  pantoprazole  (PROTONIX) 40 MG tablet Take 40 mg by mouth every morning.  08/03/19   [provider]  Saw Palmetto, Serenoa repens, (SAW PALMETTO PO) Take 2 tablets by mouth daily.    [provider]  SPIRIVA RESPIMAT 2.5 MCG/ACT AERS Inhale 2 puffs into the lungs daily.  09/05/19   [provider]    Allergies as of 01/29/2020  . (No Known Allergies)    Family History  Problem Relation Age of Onset  . Diverticulitis Mother     Social History   Socioeconomic History  . Marital status: Married    Spouse name: Not on file  . Number of children: Not on file  . Years of education: Not on file  . Highest education level: Not on file  Occupational History  . Not on file  Tobacco Use  . Smoking status: Current Every Day Smoker    Packs/day: 0.50    Years: 45.00    Pack years: 22.50    Types: Cigarettes  . Smokeless tobacco: Former Systems developer    Types: Chew  Substance and Sexual Activity  . Alcohol use: Never  . Drug use: Never  . Sexual activity: Not on file  Other Topics Concern  . Not on file  Social History Narrative  . Not on file   Social Determinants of Health   Financial Resource Strain:   . Difficulty of Paying Living Expenses:   Food Insecurity:   . Worried About Charity fundraiser  in the Last Year:   . [place] in the Last Year:   Transportation Needs:   . Film/video editor (Medical):   Marland Kitchen Lack of Transportation (Non-Medical):   Physical Activity:   . Days of Exercise per Week:   . Minutes of Exercise per Session:   Stress:   . Feeling of Stress :   Social Connections:   . Frequency of Communication with Friends and Family:   . Frequency of Social Gatherings with Friends and Family:   . Attends Religious Services:   . Active Member of Clubs or Organizations:   . Attends Archivist Meetings:   Marland Kitchen Marital Status:   Intimate Partner Violence:   . Fear of Current or Ex-Partner:   . Emotionally Abused:   Marland Kitchen Physically Abused:    . Sexually Abused:     Review of Systems: See HPI, otherwise negative ROS  Physical Exam: Pulse 66   Temp (!) 97.2 F (36.2 C) (Tympanic)   Resp 16   Ht 5\' 10"  (1.778 m)   Wt 79.7 kg   SpO2 100%   BMI 25.21 kg/m  General:   Alert,  pleasant and cooperative in NAD Head:  Normocephalic and atraumatic. Neck:  Supple; no masses or thyromegaly. Lungs:  Clear throughout to auscultation, normal respiratory effort.    Heart:  +S1, +S2, Regular rate and rhythm, No edema. Abdomen:  Soft, nontender and nondistended. Normal bowel sounds, without guarding, and without rebound.   Neurologic:  Alert and  oriented x4;  grossly normal neurologically.  Impression/Plan: Maurice Miranda is here for an colonoscopy to be performed for diverticulitis.Risks, benefits, limitations, and alternatives regarding  colonoscopy have been reviewed with the patient.  Questions have been answered.  All parties agreeable.   Jonathon Bellows, MD  02/08/2020, 8:50 AM

## 2020-02-08 NOTE — Anesthesia Preprocedure Evaluation (Signed)
Anesthesia Evaluation  Patient identified by MRN, date of birth, ID band Patient awake    Reviewed: Allergy & Precautions, NPO status , Patient's Chart, lab work & pertinent test results  History of Anesthesia Complications Negative for: history of anesthetic complications  Airway Mallampati: II  TM Distance: >3 FB Neck ROM: Full    Dental  (+) Upper Dentures, Lower Dentures   Pulmonary neg sleep apnea, neg COPD, Current Smoker and Patient abstained from smoking.,    breath sounds clear to auscultation- rhonchi (-) wheezing      Cardiovascular hypertension, Pt. on medications (-) CAD, (-) Past MI, (-) Cardiac Stents and (-) CABG  Rhythm:Regular Rate:Normal - Systolic murmurs and - Diastolic murmurs    Neuro/Psych  Headaches, neg Seizures negative psych ROS   GI/Hepatic Neg liver ROS, hiatal hernia, GERD  ,  Endo/Other  negative endocrine ROSneg diabetes  Renal/GU negative Renal ROS     Musculoskeletal negative musculoskeletal ROS (+)   Abdominal (+) - obese,   Peds  Hematology negative hematology ROS (+)   Anesthesia Other Findings Past Medical History: No date: Diverticulitis No date: Gallbladder disease No date: GERD (gastroesophageal reflux disease) No date: History of colonoscopy No date: History of hiatal hernia No date: Hypertension   Reproductive/Obstetrics                             Anesthesia Physical Anesthesia Plan  ASA: II  Anesthesia Plan: General   Post-op Pain Management:    Induction: Intravenous  PONV Risk Score and Plan: 0 and Propofol infusion  Airway Management Planned: Natural Airway  Additional Equipment:   Intra-op Plan:   Post-operative Plan:   Informed Consent: I have reviewed the patients History and Physical, chart, labs and discussed the procedure including the risks, benefits and alternatives for the proposed anesthesia with the patient or  authorized representative who has indicated his/her understanding and acceptance.     Dental advisory given  Plan Discussed with: CRNA and Anesthesiologist  Anesthesia Plan Comments:         Anesthesia Quick Evaluation

## 2020-02-08 NOTE — Anesthesia Postprocedure Evaluation (Signed)
Anesthesia Post Note  Patient: Maurice Miranda  Procedure(s) Performed: COLONOSCOPY WITH PROPOFOL (N/A )  Patient location during evaluation: Endoscopy Anesthesia Type: General Level of consciousness: awake and alert and oriented Pain management: pain level controlled Vital Signs Assessment: post-procedure vital signs reviewed and stable Respiratory status: spontaneous breathing, nonlabored ventilation and respiratory function stable Cardiovascular status: blood pressure returned to baseline and stable Postop Assessment: no signs of nausea or vomiting Anesthetic complications: no     Last Vitals:  Vitals:   02/08/20 0931 02/08/20 0941  BP: 96/67 102/69  Pulse: (!) 58   Resp: 18   Temp: (!) 36.1 C   SpO2: 98%     Last Pain:  Vitals:   02/08/20 1011  TempSrc:   PainSc: 0-No pain                 Chee Kinslow

## 2020-02-09 ENCOUNTER — Encounter: Payer: Self-pay | Admitting: *Deleted

## 2020-02-09 LAB — SURGICAL PATHOLOGY

## 2020-02-11 ENCOUNTER — Encounter: Payer: Self-pay | Admitting: Gastroenterology

## 2020-02-15 ENCOUNTER — Encounter: Payer: Self-pay | Admitting: *Deleted

## 2020-02-15 ENCOUNTER — Ambulatory Visit: Payer: 59 | Admitting: Gastroenterology

## 2020-02-15 NOTE — Progress Notes (Deleted)
Jonathon Bellows MD, MRCP(U.K) [address]  [place]  [place], [place] [address]  Main: [phone]  Fax: [phone]   Primary Care Physician: Suzan Garibaldi, FNP  Primary Gastroenterologist:  Dr. Jonathon Bellows   Follow-up for diverticulitis, dilated pancreatic duct, constipation, long-term use of NSAIDs.  HPI: Maurice Miranda is a 65 y.o. male    Summary of history :  Initially referred and seen on 12/23/2019 for diverticulitis.  He was referred to see me as his care was previously at [facility] and he was out of network with them and needed a colonoscopy after the episode of diverticulitis..  The patient presented to the emergency room on 11/04/2019 at [facility] with left lower quadrant pain.  CT scan of the abdomen and pelvis showed diverticulosis and no clear evidence of diverticulitis.  The patient had leukocytosis and due to high clinical suspicion for diverticulitis despite negative imaging was given antibiotics.  Evaluated by general surgery at that point of time and agreed with the plan.  There was also concern for 1.4 cm stone in the neck of a mildly distended gallbladder.  Normal LFTs and lipase and no right upper quadrant pain.  He was discharged home to follow-up as an outpatient.He had been taking BC powders on a daily basis for many years.  He is a Pharmacist, community.  Denies any excess alcohol consumption.   I reviewed the CT scan on 11/04/2019 which showed a dilation of the main pancreatic duct in the head of 4 mm.  Mild dilation of the CBD at 9 mm.LFTs were normal..   He states that he is here to see me because his colonoscopy that was scheduled by [facility] was out of network and was canceled at the last moment.  He states that he has had right upper quadrant pain on and off associated after eating greasy food for over a year.  In addition has had constipation since 1 year does not have a bowel movement every day very hard and causes abdominal discomfort.  Occasionally noticed blood on the stool.   No family history of colon cancer or polyps.  No prior colonoscopy in the past.    Interval history 12/23/2019-02/15/2020  12/29/2019: MRI MRCP of the abdomenAnd shows cholelithiasis without intra or extrahepatic biliary dilation.  No choledocholithiasis.  02/08/2020: Colonoscopy: 12 polyps were resected.  Ranging from 4 mm to 10 mm throughout the colon.  Diverticulosis of the transverse and left colon noted.  11 out of 12 polyps were tubular adenomas.  01/21/2020: Underwent cholecystectomy with cholelithiasis and chronic cholecystitis. 12/23/2019: H. pylori breath test: Negative:   Current Outpatient Medications  Medication Sig Dispense Refill  . albuterol (VENTOLIN HFA) 108 (90 Base) MCG/ACT inhaler Inhale 1-2 puffs into the lungs every 6 (six) hours as needed for wheezing or shortness of breath.     . Aspirin-Caffeine 845-65 MG PACK Take 1 packet by mouth as needed.    . busPIRone (BUSPAR) 7.5 MG tablet Take 7.5 mg by mouth 2 (two) times daily.    Marland Kitchen docusate sodium (COLACE) 100 MG capsule Take 200 mg by mouth daily.    . hydrOXYzine (VISTARIL) 25 MG capsule Take 25 mg by mouth 2 (two) times daily as needed for anxiety or itching.     Marland Kitchen lisinopril-hydrochlorothiazide (ZESTORETIC) 20-12.5 MG tablet Take 1 tablet by mouth at bedtime.     . ondansetron (ZOFRAN ODT) 4 MG disintegrating tablet Take 1 tablet (4 mg total) by mouth every 8 (eight) hours as needed for  nausea or vomiting. (Patient taking differently: Take 4 mg by mouth daily. AND PRN) 12 tablet 0  . pantoprazole (PROTONIX) 40 MG tablet Take 40 mg by mouth every morning.     . Saw Palmetto, Serenoa repens, (SAW PALMETTO PO) Take 2 tablets by mouth daily.    Marland Kitchen SPIRIVA RESPIMAT 2.5 MCG/ACT AERS Inhale 2 puffs into the lungs daily.      No current facility-administered medications for this visit.    Allergies as of 02/15/2020  . (No Known Allergies)    ROS:  General: Negative for anorexia, weight loss, fever, chills, fatigue,  weakness. ENT: Negative for hoarseness, difficulty swallowing , nasal congestion. CV: Negative for chest pain, angina, palpitations, dyspnea on exertion, peripheral edema.  Respiratory: Negative for dyspnea at rest, dyspnea on exertion, cough, sputum, wheezing.  GI: See history of present illness. GU:  Negative for dysuria, hematuria, urinary incontinence, urinary frequency, nocturnal urination.  Endo: Negative for unusual weight change.    Physical Examination:   There were no vitals taken for this visit.  General: Well-nourished, well-developed in no acute distress.  Eyes: No icterus. Conjunctivae pink. Mouth: Oropharyngeal mucosa moist and pink , no lesions erythema or exudate. Lungs: Clear to auscultation bilaterally. Non-labored. Heart: Regular rate and rhythm, no murmurs rubs or gallops.  Abdomen: Bowel sounds are normal, nontender, nondistended, no hepatosplenomegaly or masses, no abdominal bruits or hernia , no rebound or guarding.   Extremities: No lower extremity edema. No clubbing or deformities. Neuro: Alert and oriented x 3.  Grossly intact. Skin: Warm and dry, no jaundice.   Psych: Alert and cooperative, normal mood and affect.   Imaging Studies: No results found.  Assessment and Plan:   Maurice Miranda is a 64 y.o. y/o male here to follow-up for constipation, recent colonoscopy where his resected 11 tubular adenomas.  Plan 1  Colonoscopy to be repeated in 1 year due to 11 tubular adenomas resected.  Refer for genetic testing. 3.  Stop all NSAID use. 4.   Commence on a PPI Prilosec 40 mg once a day 6.  Commence on MiraLAX 1 capful twice daily   Dr Jonathon Bellows  MD,MRCP [facility]) Follow up in ***

## 2020-02-24 ENCOUNTER — Telehealth: Payer: Self-pay

## 2020-02-24 DIAGNOSIS — Z86010 Personal history of colonic polyps: Secondary | ICD-10-CM

## 2020-02-24 NOTE — Telephone Encounter (Signed)
Called and left a message for call back. Sent referral to genetic counseling

## 2020-02-24 NOTE — Telephone Encounter (Signed)
-----   Message from Jonathon Bellows, MD sent at 02/11/2020 11:34 AM EDT ----- Please inform patient that he had more than 10 precancerous polyps.1.  Refer for genetic testing2.  Repeat colonoscopy in 1 year

## 2020-02-25 NOTE — Telephone Encounter (Signed)
Called and left a message for call back. Sent mychart message  °

## 2020-03-10 ENCOUNTER — Inpatient Hospital Stay: Payer: 59 | Attending: Oncology | Admitting: Licensed Clinical Social Worker

## 2020-03-10 ENCOUNTER — Inpatient Hospital Stay: Payer: 59

## 2020-12-22 ENCOUNTER — Telehealth: Payer: Self-pay

## 2020-12-22 NOTE — Telephone Encounter (Signed)
Faxed medical record to [facility] per request from 10/09/2019 to 10/07/2020

## 2021-11-03 IMAGING — MR MR ABDOMEN WO/W CM MRCP
18 of 21 series · 43 of 48 positions shown · IV contrast (8ml Gadavist)
Comparison: Ultrasound exam 11/04/2019. CT abdomen/pelvis
11/04/2019.

CLINICAL DATA: Upper abdominal pain. Gallstones on recent
ultrasound exam.

EXAM:
MRI ABDOMEN WITHOUT AND WITH CONTRAST (INCLUDING MRCP)
TECHNIQUE: Multiplanar multisequence MR imaging of the abdomen was performed
both before and after the administration of intravenous contrast.
Heavily T2-weighted images of the biliary and pancreatic ducts were
obtained, and three-dimensional MRCP images were rendered by post
processing.
CONTRAST:  8mL GADAVIST GADOBUTROL 1 MMOL/ML IV SOLN

[Series 3: T2 · coronal · 6.5mm · 1.19mm/px · 1 of 33 slices shown (1 of 2)]
[im 1/33]
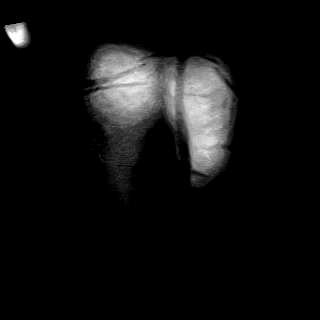

[Series 4: T2 · axial · 6.5mm · 1.19mm/px · 1 of 33 slices shown (2 of 2)]
[im 1/33]
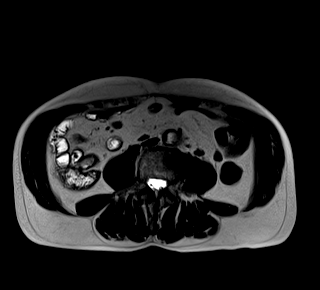

[Series 5: T1 · axial · 6.5mm · 0.74mm/px · z∈[-96,+154]mm · 3 of 66 slices shown]
[im 1/66]
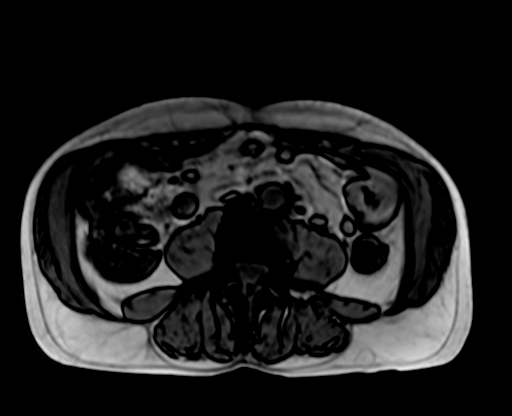
[im 33/66]
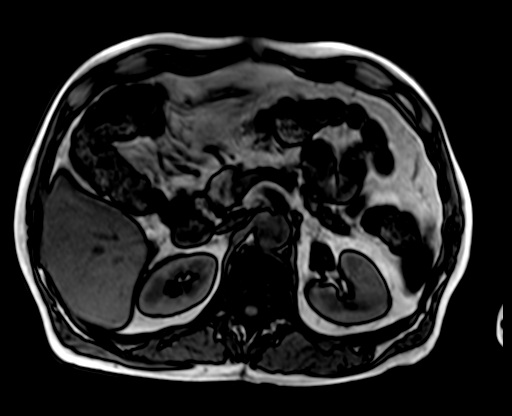
[im 66/66]
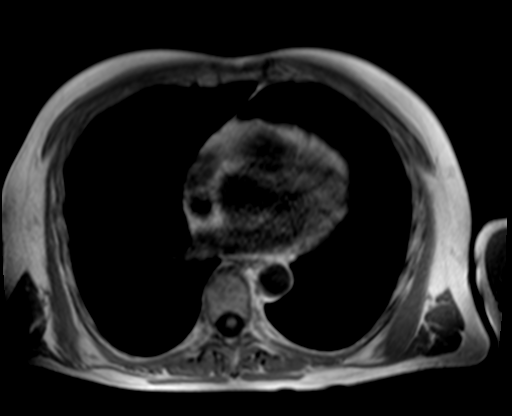

[Series 8: T2 fat-sat · axial · 6.5mm · 1.19mm/px · 1 of 33 slices shown]
[im 1/33]
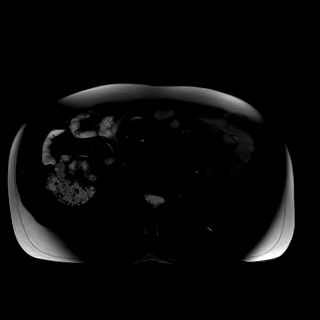

[Series 9: ax dwi_tracew · axial · 6.5mm · 1.42mm/px · z∈[-96,+162]mm · 4 of 102 slices shown]
[im 1/102]
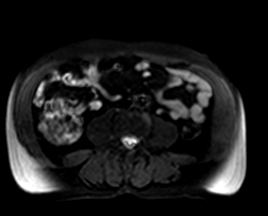
[im 34/102]
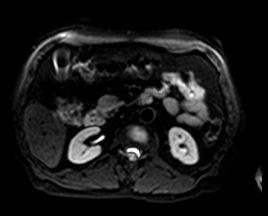
[im 68/102]
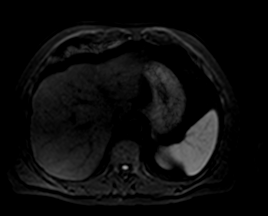
[im 102/102]
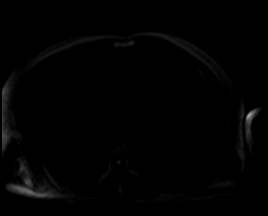

[Series 10: ax dwi_adc · axial · 6.5mm · 1.42mm/px · 1 of 34 slices shown]
[im 1/34]
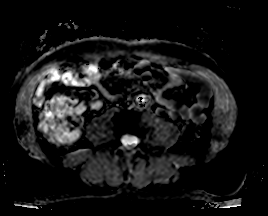

[Series 14: MRCP · coronal · 3.5mm · 1.12mm/px · 1 of 17 slices shown]
[im 1/17]
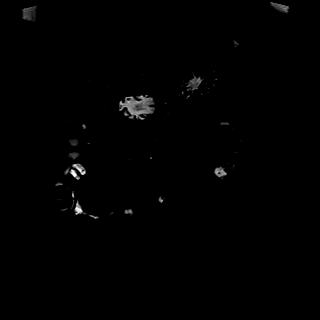

[Series 15: radials · coronal · 50.0mm · 0.78mm/px · 1 of 5 slices shown]
[im 1/5]
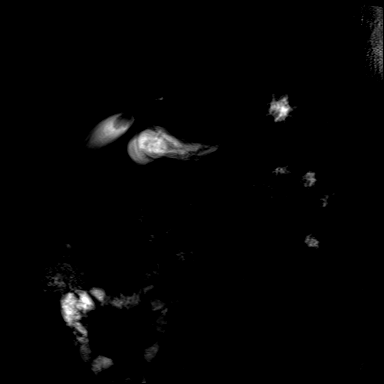

[Series 16: T1 dynamic fat-sat · axial · non-contrast · 3.5mm · 1.19mm/px · z∈[-79,+169]mm · 3 of 72 slices shown (1 of 5)]
[im 1/72]
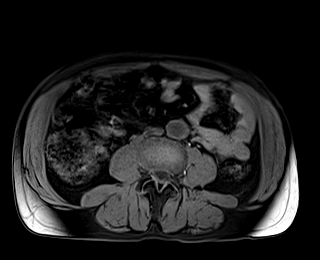
[im 36/72]
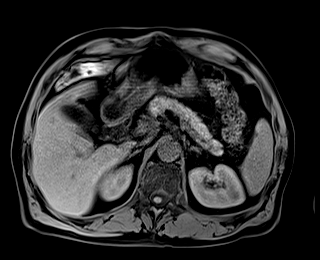
[im 72/72]
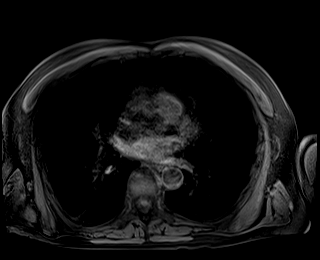

[Series 17: T1 dynamic fat-sat post-contrast · axial · 3.5mm · 1.19mm/px · z∈[-79,+169]mm · 3 of 72 slices shown (1 of 4)]
[im 1/72]
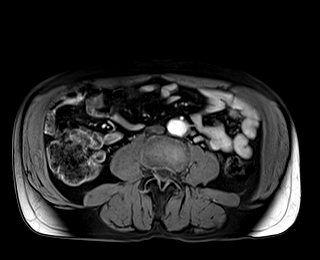
[im 36/72]
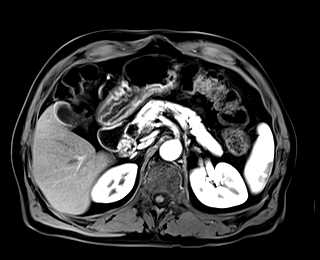
[im 72/72]
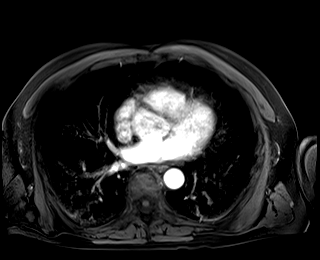

[Series 18: T1 dynamic fat-sat · axial · 3.5mm · 1.19mm/px · z∈[-79,+169]mm · 3 of 72 slices shown (2 of 5)]
[im 1/72]
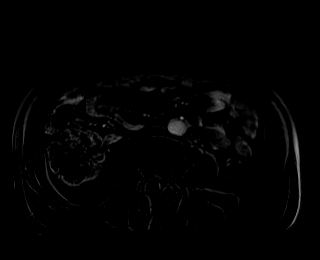
[im 36/72]
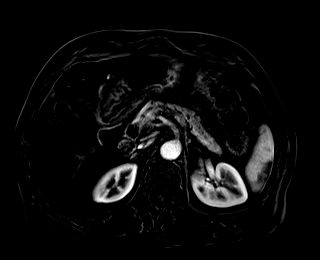
[im 72/72]
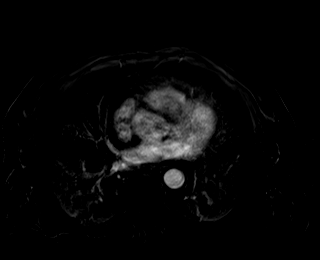

[Series 19: T1 dynamic fat-sat post-contrast · axial · 3.5mm · 1.19mm/px · z∈[-79,+169]mm · 3 of 72 slices shown (2 of 4)]
[im 1/72]
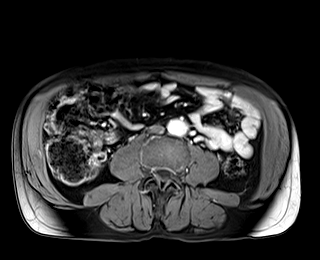
[im 36/72]
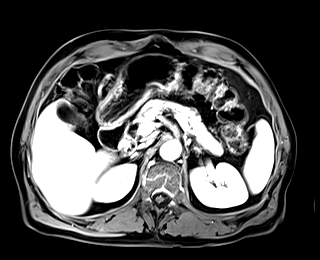
[im 72/72]
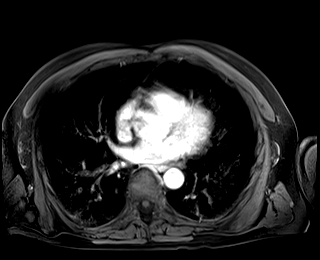

[Series 20: T1 dynamic fat-sat · axial · 3.5mm · 1.19mm/px · z∈[-79,+169]mm · 3 of 72 slices shown (3 of 5)]
[im 1/72]
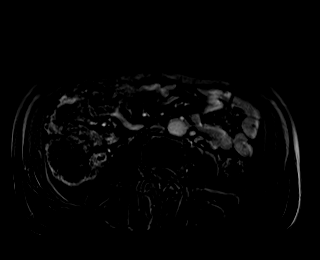
[im 36/72]
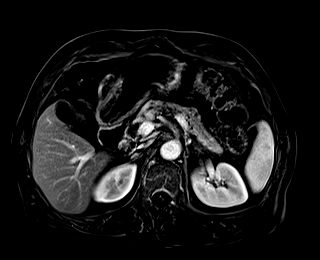
[im 72/72]
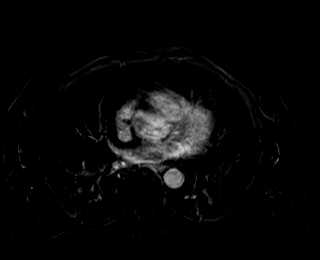

[Series 21: T1 dynamic fat-sat post-contrast · axial · 3.5mm · 1.19mm/px · z∈[-79,+169]mm · 3 of 72 slices shown (3 of 4)]
[im 1/72]
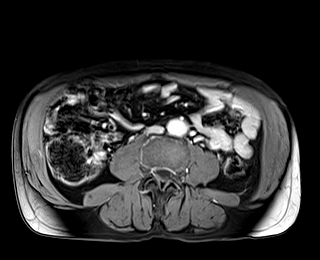
[im 36/72]
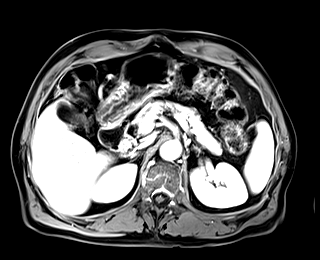
[im 72/72]
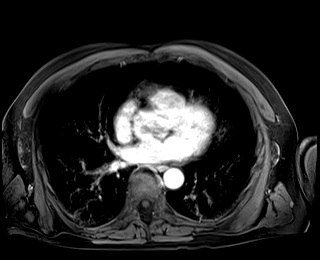

[Series 22: T1 dynamic fat-sat · axial · 3.5mm · 1.19mm/px · z∈[-79,+169]mm · 3 of 72 slices shown (4 of 5)]
[im 1/72]
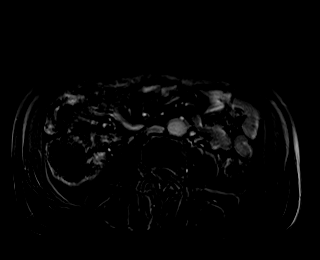
[im 36/72]
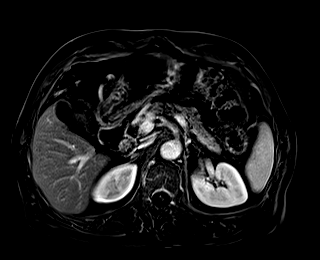
[im 72/72]
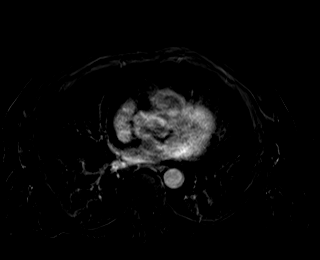

[Series 23: T1 dynamic post-contrast · coronal · 3.5mm · 1.31mm/px · 3 of 72 slices shown]
[im 1/72]
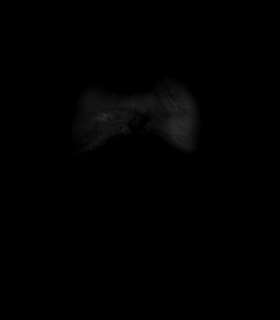
[im 36/72]
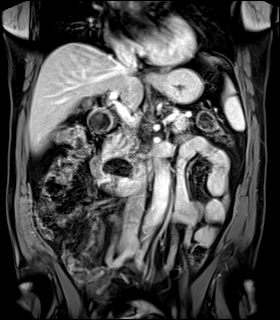
[im 72/72]
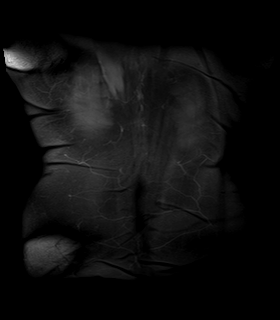

[Series 24: T1 dynamic fat-sat post-contrast · axial · 3.5mm · 1.19mm/px · z∈[-79,+169]mm · 3 of 72 slices shown (4 of 4)]
[im 1/72]
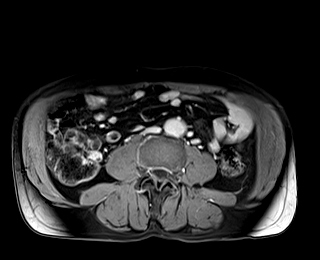
[im 36/72]
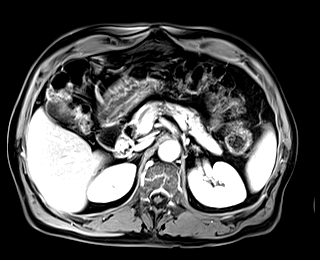
[im 72/72]
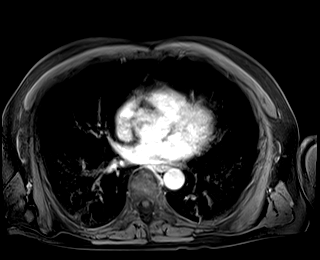

[Series 25: T1 dynamic fat-sat · axial · 3.5mm · 1.19mm/px · z∈[-79,+169]mm · 3 of 72 slices shown (5 of 5)]
[im 1/72]
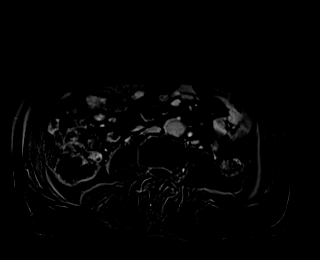
[im 36/72]
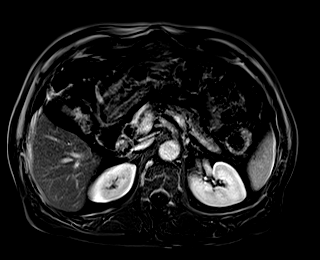
[im 72/72]
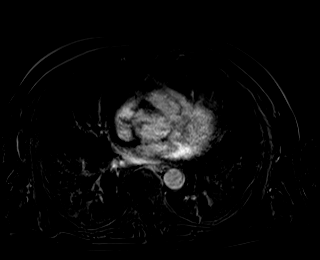

[43 of 48 positions shown; findings below may reference images not displayed]

FINDINGS: Lower chest: Unremarkable

Hepatobiliary: No suspicious focal abnormality within the liver
parenchyma. Dominant gallstone identified measuring 1.5 cm. No intra
or extrahepatic biliary duct dilatation. No evidence for
choledocholithiasis. Common bile duct measures 5 mm diameter in the
head of pancreas.

Pancreas: No focal mass lesion. No dilatation of the main duct. No
intraparenchymal cyst. No peripancreatic edema.

Spleen:  No splenomegaly. No focal mass lesion.

Adrenals/Urinary Tract: No adrenal nodule or mass. Kidneys
unremarkable.

Stomach/Bowel: Stomach is unremarkable. No gastric wall thickening.
No evidence of outlet obstruction. Duodenum is normally positioned
as is the ligament of Treitz. Large duodenal diverticulum evident.
No small bowel or colonic dilatation within the visualized abdomen

Vascular/Lymphatic: No abdominal aortic aneurysm. No abdominal
lymphadenopathy

Other:  No intraperitoneal free fluid.

Musculoskeletal: No abnormal marrow enhancement within the
visualized bony anatomy.
IMPRESSION: 1. Cholelithiasis without intra or extrahepatic biliary duct
dilatation. No choledocholithiasis.
